# Patient Record
Sex: Male | Born: 1975 | Race: White | Hispanic: No | Marital: Married | Smoking: Never smoker
Health system: Southern US, Community
[De-identification: ages and names within clinical notes are randomized; demographics above are authoritative.]

## PROBLEM LIST (undated history)

## (undated) DIAGNOSIS — K219 Gastro-esophageal reflux disease without esophagitis: Secondary | ICD-10-CM

## (undated) DIAGNOSIS — F419 Anxiety disorder, unspecified: Secondary | ICD-10-CM

## (undated) DIAGNOSIS — E785 Hyperlipidemia, unspecified: Secondary | ICD-10-CM

## (undated) DIAGNOSIS — T7840XA Allergy, unspecified, initial encounter: Secondary | ICD-10-CM

## (undated) DIAGNOSIS — I10 Essential (primary) hypertension: Secondary | ICD-10-CM

## (undated) HISTORY — DX: Hyperlipidemia, unspecified: E78.5

## (undated) HISTORY — DX: Essential (primary) hypertension: I10

## (undated) HISTORY — DX: Allergy, unspecified, initial encounter: T78.40XA

## (undated) HISTORY — PX: KNEE SURGERY: SHX244

## (undated) HISTORY — DX: Gastro-esophageal reflux disease without esophagitis: K21.9

## (undated) HISTORY — PX: WISDOM TOOTH EXTRACTION: SHX21

## (undated) HISTORY — DX: Anxiety disorder, unspecified: F41.9

---

## 2002-01-08 ENCOUNTER — Ambulatory Visit (HOSPITAL_BASED_OUTPATIENT_CLINIC_OR_DEPARTMENT_OTHER): Admission: RE | Admit: 2002-01-08 | Discharge: 2002-01-09 | Payer: Self-pay | Admitting: Orthopedic Surgery

## 2014-11-03 ENCOUNTER — Encounter: Payer: Self-pay | Admitting: Internal Medicine

## 2014-12-17 ENCOUNTER — Ambulatory Visit (INDEPENDENT_AMBULATORY_CARE_PROVIDER_SITE_OTHER): Payer: BLUE CROSS/BLUE SHIELD | Admitting: Internal Medicine

## 2014-12-17 ENCOUNTER — Encounter: Payer: Self-pay | Admitting: Internal Medicine

## 2014-12-17 VITALS — BP 140/80 | HR 81 | Ht 70.0 in | Wt 206.2 lb

## 2014-12-17 DIAGNOSIS — K219 Gastro-esophageal reflux disease without esophagitis: Secondary | ICD-10-CM | POA: Diagnosis not present

## 2014-12-17 DIAGNOSIS — Z800 Family history of malignant neoplasm of digestive organs: Secondary | ICD-10-CM | POA: Diagnosis not present

## 2014-12-17 MED ORDER — MOVIPREP 100 G PO SOLR: 1.0000 | Freq: Once | ORAL | Status: DC

## 2014-12-17 NOTE — Progress Notes (Signed)
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 Shelia Media,  regarding a family history of colon cancer and screening colonoscopy. First, patient reports long-standing problems with indigestion and heartburn. He takes over-the-counter omeprazole 10 mg daily. He is done this for several years. There has been no dysphagia. No family history of Barrett's esophagus or esophageal cancer. Next, he reports that his mother was diagnosed with colon cancer in her 23s. He was advised to undergo screening colonoscopy at age 27. He presents. GI review of systems is otherwise negative.  REVIEW OF SYSTEMS:  All non-GI ROS negative except for anxiety  Past Medical History  Diagnosis Date  . Anxiety   . GERD (gastroesophageal reflux disease)     Past Surgical History  Procedure Laterality Date  . Knee surgery      Social History TEYON ODETTE  reports that he has never smoked. He does not have any smokeless tobacco history on file. He reports that he drinks alcohol. He reports that he does not use illicit drugs.  family history includes Colon cancer in his mother.  No Known Allergies     PHYSICAL EXAMINATION: Vital signs: BP 140/80 mmHg  Pulse 81  Ht 5\' 10"  (1.778 m)  Wt 206 lb 3.2 oz (93.532 kg)  BMI 29.59 kg/m2  SpO2 96%  Constitutional: generally well-appearing, no acute distress Psychiatric: alert and oriented x3, cooperative Eyes: extraocular movements intact, anicteric, conjunctiva pink Mouth: oral pharynx moist, no lesions Neck: supple no lymphadenopathy Cardiovascular: heart regular rate and rhythm, no murmur Lungs: clear to auscultation bilaterally Abdomen: soft, nontender, nondistended, no obvious ascites, no peritoneal signs, normal bowel sounds, no organomegaly Rectal: Deferred until colonoscopy Extremities: no lower extremity edema  bilaterally Skin: no lesions on visible extremities Neuro: No focal deficits.   ASSESSMENT:  #1. GERD without alarm features managed with low-dose PPI #2. MI history of colon cancer in his mother-60s   PLAN:  #1. Reflux precautions #2. Continue with lowest dose of acid suppressive medication to control symptoms #3. Screening colonoscopy.The nature of the procedure, as well as the risks, benefits, and alternatives were carefully and thoroughly reviewed with the patient. Ample time for discussion and questions allowed. The patient understood, was satisfied, and agreed to proceed.  A copy has been sent to his primary care physician Dr. Shelia Media

## 2014-12-17 NOTE — Patient Instructions (Addendum)
You have been scheduled for a colonoscopy. Please follow written instructions given to you at your visit today.  Please pick up your prep supplies at the pharmacy within the next 1-3 days. If you use inhalers (even only as needed), please bring them with you on the day of your procedure.   

## 2015-01-01 ENCOUNTER — Encounter: Payer: Self-pay | Admitting: Internal Medicine

## 2015-01-15 ENCOUNTER — Other Ambulatory Visit: Payer: Self-pay | Admitting: Internal Medicine

## 2015-01-15 ENCOUNTER — Ambulatory Visit (AMBULATORY_SURGERY_CENTER): Payer: BLUE CROSS/BLUE SHIELD | Admitting: Internal Medicine

## 2015-01-15 ENCOUNTER — Encounter: Payer: Self-pay | Admitting: Internal Medicine

## 2015-01-15 VITALS — BP 92/47 | HR 51 | Temp 96.4°F | Resp 23 | Ht 70.0 in | Wt 206.0 lb

## 2015-01-15 DIAGNOSIS — Z1211 Encounter for screening for malignant neoplasm of colon: Secondary | ICD-10-CM

## 2015-01-15 DIAGNOSIS — Z800 Family history of malignant neoplasm of digestive organs: Secondary | ICD-10-CM | POA: Diagnosis present

## 2015-01-15 DIAGNOSIS — D124 Benign neoplasm of descending colon: Secondary | ICD-10-CM

## 2015-01-15 DIAGNOSIS — K635 Polyp of colon: Secondary | ICD-10-CM

## 2015-01-15 HISTORY — PX: COLONOSCOPY: SHX174

## 2015-01-15 MED ORDER — SODIUM CHLORIDE 0.9 % IV SOLN: 500.0000 mL | INTRAVENOUS | Status: DC

## 2015-01-15 NOTE — Progress Notes (Signed)
Called to room to assist during endoscopic procedure.  Patient ID and intended procedure confirmed with present staff. Received instructions for my participation in the procedure from the performing physician.  

## 2015-01-15 NOTE — Progress Notes (Signed)
Patient awakening,vss,report to rn 

## 2015-01-15 NOTE — Op Note (Signed)
[facility]  [address] [place], [address]   COLONOSCOPY PROCEDURE REPORT  PATIENT: Evan, Phillips  MR#: [id number] BIRTHDATE: 07/31/1976 , 39  yrs. old GENDER: male ENDOSCOPIST: Eustace Quail, MD REFERRED VH:QIONGE Delight Hoh, M.D. PROCEDURE DATE:  01/15/2015 PROCEDURE:   Colonoscopy, screening and Colonoscopy with snare polypectomy x 1 First Screening Colonoscopy - Avg.  risk and is 50 yrs.  old or older - No.  Prior Negative Screening - Now for repeat screening. N/A  History of Adenoma - Now for follow-up colonoscopy & has been > or = to 3 yrs.  N/A  Polyps removed today? Yes ASA CLASS:   Class I INDICATIONS:Screening for colonic neoplasia and FH Colon or Rectal Adenocarcinoma. Mom 25s MEDICATIONS: Monitored anesthesia care and Propofol 400 mg IV  DESCRIPTION OF PROCEDURE:   After the risks benefits and alternatives of the procedure were thoroughly explained, informed consent was obtained.  The digital rectal exam revealed no abnormalities of the rectum.   The LB [id number]-H190 [id number]  endoscope was introduced through the anus and advanced to the cecum, which was identified by both the appendix and ileocecal valve. No adverse events experienced.   The quality of the prep was excellent. (MoviPrep was used)  The instrument was then slowly withdrawn as the colon was fully examined.      COLON FINDINGS: A single polyp measuring 2 mm in size was found in the descending colon.  A polypectomy was performed with a cold snare.  The resection was complete, the polyp tissue was completely retrieved and sent to histology.   There was moderate diverticulosis noted in the sigmoid colon.   The examination was otherwise normal.  Retroflexed views revealed internal hemorrhoids. The time to cecum = 2.2 Withdrawal time = 11.8   The scope was withdrawn and the procedure completed. COMPLICATIONS: There were no immediate complications.  ENDOSCOPIC IMPRESSION: 1.   Single  polyp was found in the descending colon; polypectomy was performed with a cold snare 2.   Moderate diverticulosis was noted in the sigmoid colon 3.   The examination was otherwise normal  RECOMMENDATIONS: 1. Follow up colonoscopy in 5 years  (family history)  eSigned:  Eustace Quail, MD 01/15/2015 2:50 PM   cc: Alden Server, MD and The Patient

## 2015-01-15 NOTE — Patient Instructions (Signed)
Impressions/recommendations:  Polyp (handout given) Diverticulosis (handout given) High fiber diet (handout given)  YOU HAD AN ENDOSCOPIC PROCEDURE TODAY AT [facility]:   Refer to the procedure report that was given to you for any specific questions about what was found during the examination.  If the procedure report does not answer your questions, please call your gastroenterologist to clarify.  If you requested that your care partner not be given the details of your procedure findings, then the procedure report has been included in a sealed envelope for you to review at your convenience later.  YOU SHOULD EXPECT: Some feelings of bloating in the abdomen. Passage of more gas than usual.  Walking can help get rid of the air that was put into your GI tract during the procedure and reduce the bloating. If you had a lower endoscopy (such as a colonoscopy or flexible sigmoidoscopy) you may notice spotting of blood in your stool or on the toilet paper. If you underwent a bowel prep for your procedure, you may not have a normal bowel movement for a few days.  Please Note:  You might notice some irritation and congestion in your nose or some drainage.  This is from the oxygen used during your procedure.  There is no need for concern and it should clear up in a day or so.  SYMPTOMS TO REPORT IMMEDIATELY:   Following lower endoscopy (colonoscopy or flexible sigmoidoscopy):  Excessive amounts of blood in the stool  Significant tenderness or worsening of abdominal pains  Swelling of the abdomen that is new, acute  Fever of 100F or higher   For urgent or emergent issues, a gastroenterologist can be reached at any hour by calling [phone].   DIET: Your first meal following the procedure should be a small meal and then it is ok to progress to your normal diet. Heavy or fried foods are harder to digest and may make you feel nauseous or bloated.  Likewise, meals heavy in dairy and  vegetables can increase bloating.  Drink plenty of fluids but you should avoid alcoholic beverages for 24 hours.  ACTIVITY:  You should plan to take it easy for the rest of today and you should NOT DRIVE or use heavy machinery until tomorrow (because of the sedation medicines used during the test).    FOLLOW UP: Our staff will call the number listed on your records the next business day following your procedure to check on you and address any questions or concerns that you may have regarding the information given to you following your procedure. If we do not reach you, we will leave a message.  However, if you are feeling well and you are not experiencing any problems, there is no need to return our call.  We will assume that you have returned to your regular daily activities without incident.  If any biopsies were taken you will be contacted by phone or by letter within the next 1-3 weeks.  Please call us at [phone] if you have not heard about the biopsies in 3 weeks.    SIGNATURES/CONFIDENTIALITY: You and/or your care partner have signed paperwork which will be entered into your electronic medical record.  These signatures attest to the fact that that the information above on your After Visit Summary has been reviewed and is understood.  Full responsibility of the confidentiality of this discharge information lies with you and/or your care-partner.

## 2015-01-16 ENCOUNTER — Telehealth: Payer: Self-pay | Admitting: *Deleted

## 2015-01-16 NOTE — Telephone Encounter (Signed)
  Follow up Call-  Call back number 01/15/2015  Post procedure Call Back phone  # [phone] hm  Permission to leave phone message Yes     Patient questions:  Do you have a fever, pain , or abdominal swelling? No. Pain Score  0 *  Have you tolerated food without any problems? Yes.    Have you been able to return to your normal activities? Yes.    Do you have any questions about your discharge instructions: Diet   No. Medications  No. Follow up visit  No.  Do you have questions or concerns about your Care? No.  Actions: * If pain score is 4 or above: No action needed, pain <4.

## 2015-01-20 ENCOUNTER — Encounter: Payer: Self-pay | Admitting: Internal Medicine

## 2019-10-14 ENCOUNTER — Ambulatory Visit: Payer: Self-pay

## 2019-10-14 ENCOUNTER — Ambulatory Visit: Payer: BC Managed Care – PPO | Attending: Internal Medicine

## 2019-10-14 DIAGNOSIS — Z20822 Contact with and (suspected) exposure to covid-19: Secondary | ICD-10-CM

## 2019-10-14 DIAGNOSIS — U071 COVID-19: Secondary | ICD-10-CM | POA: Insufficient documentation

## 2019-10-15 LAB — NOVEL CORONAVIRUS, NAA: SARS-CoV-2, NAA: DETECTED — AB

## 2020-01-08 ENCOUNTER — Encounter: Payer: Self-pay | Admitting: Gastroenterology

## 2020-07-21 ENCOUNTER — Encounter: Payer: Self-pay | Admitting: Internal Medicine

## 2020-07-21 ENCOUNTER — Ambulatory Visit (AMBULATORY_SURGERY_CENTER): Payer: Self-pay | Admitting: *Deleted

## 2020-07-21 ENCOUNTER — Other Ambulatory Visit: Payer: Self-pay

## 2020-07-21 VITALS — Ht 70.0 in | Wt 197.0 lb

## 2020-07-21 DIAGNOSIS — Z800 Family history of malignant neoplasm of digestive organs: Secondary | ICD-10-CM

## 2020-07-21 MED ORDER — SUTAB 1479-225-188 MG PO TABS: 1.0000 | ORAL_TABLET | ORAL | 0 refills | Status: DC

## 2020-07-21 NOTE — Progress Notes (Signed)
Patient is here in-person for PV. Patient denies any allergies to eggs or soy. Patient denies any problems with anesthesia/sedation. Patient denies any oxygen use at home. Patient denies taking any diet/weight loss medications or blood thinners. Patient is not being treated for MRSA or C-diff. Patient is aware of our care-partner policy and [id number] safety protocol.    COVID-19 vaccines completed on 05/12/20, per patient.   Prep Prescription coupon given to the patient.

## 2020-08-04 ENCOUNTER — Other Ambulatory Visit: Payer: Self-pay

## 2020-08-04 ENCOUNTER — Encounter: Payer: Self-pay | Admitting: Internal Medicine

## 2020-08-04 ENCOUNTER — Ambulatory Visit (AMBULATORY_SURGERY_CENTER): Payer: BC Managed Care – PPO | Admitting: Internal Medicine

## 2020-08-04 VITALS — BP 111/53 | HR 54 | Temp 97.1°F | Resp 11 | Ht 70.0 in | Wt 197.0 lb

## 2020-08-04 DIAGNOSIS — Z800 Family history of malignant neoplasm of digestive organs: Secondary | ICD-10-CM

## 2020-08-04 DIAGNOSIS — Z86010 Personal history of colonic polyps: Secondary | ICD-10-CM | POA: Diagnosis not present

## 2020-08-04 DIAGNOSIS — Z1211 Encounter for screening for malignant neoplasm of colon: Secondary | ICD-10-CM

## 2020-08-04 MED ORDER — SODIUM CHLORIDE 0.9 % IV SOLN: 500.0000 mL | Freq: Once | INTRAVENOUS | Status: DC

## 2020-08-04 NOTE — Progress Notes (Signed)
Report to PACU, RN, vss, BBS= Clear.  

## 2020-08-04 NOTE — Progress Notes (Signed)
previsit done by RM.  Pt states no changes to health hx since visit.  VS by CW

## 2020-08-04 NOTE — Op Note (Signed)
[facility] Patient Name: Evan Phillips Procedure Date: 08/04/2020 10:07 AM MRN: [id number] Endoscopist: Docia Chuck. Henrene Pastor , MD Age: 44 Referring MD:  Date of Birth: 1975/10/12 Gender: Male Account #: [id number] Procedure:                Colonoscopy Indications:              Screening in patient at increased risk: Colorectal                            cancer in mother age 109's. Previous examination May                            2016 was negative for neoplasia Medicines:                Monitored Anesthesia Care Procedure:                Pre-Anesthesia Assessment:                           - Prior to the procedure, a History and Physical                            was performed, and patient medications and                            allergies were reviewed. The patient's tolerance of                            previous anesthesia was also reviewed. The risks                            and benefits of the procedure and the sedation                            options and risks were discussed with the patient.                            All questions were answered, and informed consent                            was obtained. Prior Anticoagulants: The patient has                            taken no previous anticoagulant or antiplatelet                            agents. ASA Grade Assessment: II - A patient with                            mild systemic disease. After reviewing the risks                            and benefits, the patient was deemed in  satisfactory condition to undergo the procedure.                           After obtaining informed consent, the colonoscope                            was passed under direct vision. Throughout the                            procedure, the patient's blood pressure, pulse, and                            oxygen saturations were monitored continuously. The                            Colonoscope was introduced  through the anus and                            advanced to the the cecum, identified by                            appendiceal orifice and ileocecal valve. The                            ileocecal valve, appendiceal orifice, and rectum                            were photographed. The quality of the bowel                            preparation was excellent. The colonoscopy was                            performed without difficulty. The patient tolerated                            the procedure well. The bowel preparation used was                            SUPREP via split dose instruction. Scope In: 10:15:16 AM Scope Out: 10:26:54 AM Scope Withdrawal Time: 0 hours 9 minutes 17 seconds  Total Procedure Duration: 0 hours 11 minutes 38 seconds  Findings:                 Multiple diverticula were found in the sigmoid                            colon.                           Internal hemorrhoids were found during                            retroflexion. The hemorrhoids were moderate.  The exam was otherwise without abnormality on                            direct and retroflexion views. Complications:            No immediate complications. Estimated blood loss:                            None. Estimated Blood Loss:     Estimated blood loss: none. Impression:               - Diverticulosis in the sigmoid colon.                           - Internal hemorrhoids.                           - The examination was otherwise normal on direct                            and retroflexion views.                           - No specimens collected. Recommendation:           - Repeat colonoscopy in 5 years for surveillance                            (family history).                           - Patient has a contact number available for                            emergencies. The signs and symptoms of potential                            delayed complications were discussed  with the                            patient. Return to normal activities tomorrow.                            Written discharge instructions were provided to the                            patient.                           - Resume previous diet.                           - Continue present medications. Docia Chuck. Henrene Pastor, MD 08/04/2020 10:35:39 AM This report has been signed electronically.

## 2020-08-04 NOTE — Patient Instructions (Signed)
Handouts Provided:  Diverticulosis ? ?YOU HAD AN ENDOSCOPIC PROCEDURE TODAY AT THE [facility]:   Refer to the procedure report that was given to you for any specific questions about what was found during the examination.  If the procedure report does not answer your questions, please call your gastroenterologist to clarify.  If you requested that your care partner not be given the details of your procedure findings, then the procedure report has been included in a sealed envelope for you to review at your convenience later. ? ?YOU SHOULD EXPECT: Some feelings of bloating in the abdomen. Passage of more gas than usual.  Walking can help get rid of the air that was put into your GI tract during the procedure and reduce the bloating. If you had a lower endoscopy (such as a colonoscopy or flexible sigmoidoscopy) you may notice spotting of blood in your stool or on the toilet paper. If you underwent a bowel prep for your procedure, you may not have a normal bowel movement for a few days. ? ?Please Note:  You might notice some irritation and congestion in your nose or some drainage.  This is from the oxygen used during your procedure.  There is no need for concern and it should clear up in a day or so. ? ?SYMPTOMS TO REPORT IMMEDIATELY: ? ?Following lower endoscopy (colonoscopy or flexible sigmoidoscopy): ? Excessive amounts of blood in the stool ? Significant tenderness or worsening of abdominal pains ? Swelling of the abdomen that is new, acute ? Fever of 100?F or higher ? ?For urgent or emergent issues, a gastroenterologist can be reached at any hour by calling [phone]. ?Do not use MyChart messaging for urgent concerns.  ? ? ?DIET:  We do recommend a small meal at first, but then you may proceed to your regular diet.  Drink plenty of fluids but you should avoid alcoholic beverages for 24 hours. ? ?ACTIVITY:  You should plan to take it easy for the rest of today and you should NOT DRIVE or use heavy  machinery until tomorrow (because of the sedation medicines used during the test).   ? ?FOLLOW UP: ?Our staff will call the number listed on your records 48-72 hours following your procedure to check on you and address any questions or concerns that you may have regarding the information given to you following your procedure. If we do not reach you, we will leave a message.  We will attempt to reach you two times.  During this call, we will ask if you have developed any symptoms of COVID 19. If you develop any symptoms (ie: fever, flu-like symptoms, shortness of breath, cough etc.) before then, please call [phone].  If you test positive for Covid 19 in the 2 weeks post procedure, please call and report this information to us.   ? ?If any biopsies were taken you will be contacted by phone or by letter within the next 1-3 weeks.  Please call us at [phone] if you have not heard about the biopsies in 3 weeks.  ? ? ?SIGNATURES/CONFIDENTIALITY: ?You and/or your care partner have signed paperwork which will be entered into your electronic medical record.  These signatures attest to the fact that that the information above on your After Visit Summary has been reviewed and is understood.  Full responsibility of the confidentiality of this discharge information lies with you and/or your care-partner. ? ?

## 2020-08-06 ENCOUNTER — Telehealth: Payer: Self-pay

## 2020-08-06 NOTE — Telephone Encounter (Signed)
  Follow up Call-  Call back number 08/04/2020  Post procedure Call Back phone  # [phone]  Permission to leave phone message Yes  Some recent data might be hidden     Patient questions:  Do you have a fever, pain , or abdominal swelling? No. Pain Score  0 *  Have you tolerated food without any problems? Yes.    Have you been able to return to your normal activities? Yes.    Do you have any questions about your discharge instructions: Diet   No. Medications  No. Follow up visit  No.  Do you have questions or concerns about your Care? No.  Actions: * If pain score is 4 or above: No action needed, pain <4.  1. Have you developed a fever since your procedure? no  2.   Have you had an respiratory symptoms (SOB or cough) since your procedure? no  3.   Have you tested positive for COVID 19 since your procedure no  4.   Have you had any family members/close contacts diagnosed with the COVID 19 since your procedure?  no   If yes to any of these questions please route to Joylene John, RN and Joella Prince, RN

## 2021-01-19 ENCOUNTER — Other Ambulatory Visit: Payer: Self-pay | Admitting: Chiropractic Medicine

## 2021-01-19 DIAGNOSIS — M25562 Pain in left knee: Secondary | ICD-10-CM

## 2021-01-31 ENCOUNTER — Ambulatory Visit
Admission: RE | Admit: 2021-01-31 | Discharge: 2021-01-31 | Disposition: A | Discharge status: Home / Self Care | Payer: BC Managed Care – PPO | Source: Ambulatory Visit | Attending: Chiropractic Medicine | Admitting: Chiropractic Medicine

## 2021-01-31 DIAGNOSIS — M25562 Pain in left knee: Secondary | ICD-10-CM

## 2021-04-06 ENCOUNTER — Ambulatory Visit: Payer: BC Managed Care – PPO | Attending: Orthopedic Surgery

## 2021-04-06 ENCOUNTER — Other Ambulatory Visit: Payer: Self-pay

## 2021-04-06 DIAGNOSIS — M6281 Muscle weakness (generalized): Secondary | ICD-10-CM | POA: Diagnosis present

## 2021-04-06 DIAGNOSIS — M25562 Pain in left knee: Secondary | ICD-10-CM | POA: Diagnosis present

## 2021-04-06 DIAGNOSIS — M25662 Stiffness of left knee, not elsewhere classified: Secondary | ICD-10-CM | POA: Diagnosis present

## 2021-04-06 DIAGNOSIS — R2689 Other abnormalities of gait and mobility: Secondary | ICD-10-CM | POA: Diagnosis present

## 2021-04-06 DIAGNOSIS — R600 Localized edema: Secondary | ICD-10-CM | POA: Diagnosis present

## 2021-04-06 NOTE — Patient Instructions (Signed)
Access Code: [email or web address] URL: [email or web address] Date: 04/06/2021 Prepared by: Claiborne Billings  Exercises Long Sitting 4 Way Patellar Glide - 2 x daily - 7 x weekly - 1 sets - 10 reps Supine Quad Set - 3 x daily - 7 x weekly - 2 sets - 10 reps - 5 hold Standing Weight Shift - 2 x daily - 7 x weekly - 2 sets - 10 reps Stride Stance Weight Shift - 1 x daily - 7 x weekly - 3 sets - 10 reps

## 2021-04-06 NOTE — Therapy (Addendum)
[facility] [address], [place], [place], [address] Phone: [phone]   Fax:  [phone]  Physical Therapy Evaluation  Patient Details  Name: Evan Phillips MRN: [id number] Date of Birth: [birth year] Referring Provider (PT): Harden Mo, MD   Encounter Date: 04/06/2021   PT End of Session - 04/06/21 1148     Visit Number 1    Date for PT Re-Evaluation 06/01/21    Authorization Type BCBS- no visit limit    PT Start Time 1106    PT Stop Time 1152    PT Time Calculation (min) 46 min    Equipment Utilized During Treatment Left knee immobilizer    Activity Tolerance Patient tolerated treatment well    Behavior During Therapy [facility] for tasks assessed/performed             Past Medical History:  Diagnosis Date   Allergy    Anxiety    GERD (gastroesophageal reflux disease)    Hyperlipidemia    Hypertension     Past Surgical History:  Procedure Laterality Date   COLONOSCOPY  01/15/2015   Henrene Pastor hyperplastic polyp   KNEE SURGERY     WISDOM TOOTH EXTRACTION      There were no vitals filed for this visit.    Subjective Assessment - 04/06/21 1111     Subjective Pt reports to PT s/p Lt ACL allograft repair on 03/31/21.  Pt had injury in April 2022 and has been wearing a brace prior to surgery.  Pt is now walking with immobilizer and 1 crutch.    Pertinent History ACL repair 03/31/21, HTN.   FOLLOW PROTOCOL- in staff office drawer    Patient Stated Goals return to prior level of function, improve strength of Lt LE, walk without device    Currently in Pain? Yes    Pain Score 3     Pain Location Knee    Pain Orientation Left    Pain Type Surgical pain    Pain Onset 1 to 4 weeks ago    Pain Frequency Intermittent    Aggravating Factors  standing, walking    Pain Relieving Factors rest, ice, sometimes pain meds                [facility] PT Assessment - 04/06/21 0001       Assessment   Medical Diagnosis  s/p Lt ACL reconstruction    Referring Provider (PT) Harden Mo, MD    Onset Date/Surgical Date 03/31/21    Next MD Visit today      Precautions   Precautions Knee    Precaution Comments ACL repair- follow protocol    Required Braces or Orthoses Knee Immobilizer - Left      Restrictions   Weight Bearing Restrictions No      Balance Screen   Has the patient fallen in the past 6 months No    Has the patient had a decrease in activity level because of a fear of falling?  No    Is the patient reluctant to leave their home because of a fear of falling?  No      Home Social worker Private residence    Living Arrangements Spouse/significant other;Children    Type of [place] One level    [place]      Prior Function   Level of [place] Full time employment    Vocation  Requirements works from home    Sigel exercises      Cognition   Overall Cognitive Status Within Functional Limits for tasks assessed      Observation/Other Assessments   Focus on Therapeutic Outcomes (FOTO)  36 (goal is 74)      Posture/Postural Control   Posture/Postural Control No significant limitations      ROM / Strength   AROM / PROM / Strength AROM;PROM;Strength      AROM   Overall AROM  Deficits    Overall AROM Comments Lt knee 0-40 per protocol limits, Rt knee WFLs      PROM   Overall PROM  Deficits    Overall PROM Comments Rt knee 0-90 limited by protocol      Strength   Overall Strength Due to precautions;Unable to assess    Overall Strength Comments Rt LE 5/5.  Lt hip 4+/5, knee not tested      Palpation   Patella mobility normal medial/lateral mobility, limited inferior mobility by 25%    Palpation comment Boggy edema at distal Lt quad.  Midpatellar Lt 16 in, Rt 15.5 in.  Distal quad: LT 17.5 in, Rt 16 inches      Transfers   Transfers Sit to Stand;Stand to Sit    Sit to Stand With upper extremity  assist    Stand to Sit 6: Modified independent (Device/Increase time);With upper extremity assist      Ambulation/Gait   Ambulation/Gait Yes    Ambulation/Gait Assistance 6: Modified independent (Device/Increase time)    Assistive device Crutches    Gait Pattern Step-through pattern;Decreased stance time - left    Ambulation Surface Level    Gait Comments no knee flexion on the Lt due to immobilizer                        Objective measurements completed on examination: See above findings.       [place] Adult PT Treatment/Exercise - 04/06/21 0001       Modalities   Modalities Vasopneumatic      Vasopneumatic   Number Minutes Vasopneumatic  15 minutes    Vasopnuematic Location  Knee    Vasopneumatic Pressure Medium    Vasopneumatic Temperature  3 snowflakes                    PT Education - 04/06/21 1142     Education Details Access Code: [email or web address]    Person(s) Educated Patient    Methods Explanation;Demonstration;Handout    Comprehension Verbalized understanding;Returned demonstration              PT Short Term Goals - 04/06/21 1201       PT SHORT TERM GOAL #1   Title be independent in initial HEP    Time 4    Period Weeks    Status New    Target Date 05/04/21      PT SHORT TERM GOAL #2   Title demonstrate good quad set to perform SLR x 5 without lag    Time 4    Status New    Target Date 05/04/21      PT SHORT TERM GOAL #3   Title wean from immobilizer and demonstrate quad control and safe gait on all distances    Time 4    Period Weeks    Status New    Target Date 05/04/21      PT SHORT TERM GOAL #4  Title demonstrate 100 degrees of Lt knee flexion active and passively to improve sit to stand and negotiating steps    Time 4    Period Weeks    Status New    Target Date 05/04/21               PT Long Term Goals - 04/06/21 1203       PT LONG TERM GOAL #1   Title be independent in advanced HEP    Time 8     Period Weeks    Status New    Target Date 06/01/21      PT LONG TERM GOAL #2   Title improve FOTO to > or = to 74 to improve functional mobility    Time 8    Period Weeks    Status New    Target Date 06/01/21      PT LONG TERM GOAL #3   Title wean from crutch and immobilizer and demonstrate quad control with level surface ambulation and ascending steps on Lt    Time 8    Period Weeks    Status New    Target Date 06/01/21      PT LONG TERM GOAL #4   Title demonstrate Lt knee A/ROM flexion to > or = to 125 degrees to improve squatting    Time 8    Period Weeks    Target Date 06/01/21      PT LONG TERM GOAL #5   Title demonstrate > or = to 4+/5 Lt quad, hamstring and hip flexor strength to improve endurance and safety in the community    Time 8    Period Weeks    Status New    Target Date 06/01/21      Additional Long Term Goals   Additional Long Term Goals Yes      PT LONG TERM GOAL #6   Title improve Lt quad strength to descend steps with good eccentric control    Time 8    Status New    Target Date 06/01/21                    Plan - 04/06/21 1159     Clinical Impression Statement Pt presents to PT s/p Lt ACL repair using allograft.  Pt had injury to the knee with ACL rupture in April 2022.  Pt has been ambulating with a single crutch and knee immobilizer for all distances.  A/ROM and P/ROM of the Lt knee are limited by protocol at this time and pt is using CPM at home.  Pt with poor quad set in supine and instability of the Lt knee with weightbearing into the Lt LE at countertop. Reduced patellar mobility superior/inferior and boggy edema in the distal Lt quad.  Pt ambulates with a single crutch with step through wearing immobilizer.  Pt will benefit from skilled PT for safe progression of ROM, strength and gait following a post-op protocol.    Personal Factors and Comorbidities Comorbidity 1    Comorbidities ACL repair-LT    Examination-Activity Limitations  Locomotion Level;Squat;Stairs;Stand;Transfers    Examination-Participation Restrictions Community Activity;Occupation    Stability/Clinical Decision Making Stable/Uncomplicated    Clinical Decision Making Low    Rehab Potential Excellent    PT Frequency 2x / week    PT Duration 8 weeks    PT Treatment/Interventions ADLs/Self Care Home Management;Cryotherapy;Electrical Stimulation;Moist Heat;Iontophoresis '4mg'$ /ml Dexamethasone;Gait training;Stair training;Functional mobility training;Therapeutic activities;Therapeutic exercise;Balance training;Neuromuscular re-education;Manual techniques;Patient/family education;Passive range of  motion;Taping;Vasopneumatic Device    PT Next Visit Plan work on quad strength, edema management, follow protocol for ROM and strength    PT Home Exercise Plan Access Code: [email or web address]             Patient will benefit from skilled therapeutic intervention in order to improve the following deficits and impairments:  Abnormal gait, Decreased activity tolerance, Decreased balance, Decreased strength, Increased edema, Impaired flexibility, Decreased scar mobility, Pain, Decreased endurance, Difficulty walking, Decreased range of motion  Visit Diagnosis: Acute pain of left knee - Plan: PT plan of care cert/re-cert  Stiffness of left knee, not elsewhere classified - Plan: PT plan of care cert/re-cert  Other abnormalities of gait and mobility - Plan: PT plan of care cert/re-cert  Localized edema - Plan: PT plan of care cert/re-cert  Muscle weakness (generalized) - Plan: PT plan of care cert/re-cert     Problem List There are no problems to display for this patient.    [name], PT 04/06/21 12:10 PM   [facility] [address] [place], [place], [address] Phone: [phone]   Fax:  [phone]  Name: BAYLEY BEAMESDERFER MRN: [id number] Date of Birth: 1975-09-18

## 2021-04-08 ENCOUNTER — Other Ambulatory Visit: Payer: Self-pay

## 2021-04-08 ENCOUNTER — Ambulatory Visit: Payer: BC Managed Care – PPO

## 2021-04-08 DIAGNOSIS — R600 Localized edema: Secondary | ICD-10-CM

## 2021-04-08 DIAGNOSIS — M25562 Pain in left knee: Secondary | ICD-10-CM

## 2021-04-08 DIAGNOSIS — M25662 Stiffness of left knee, not elsewhere classified: Secondary | ICD-10-CM

## 2021-04-08 DIAGNOSIS — R2689 Other abnormalities of gait and mobility: Secondary | ICD-10-CM

## 2021-04-08 DIAGNOSIS — M6281 Muscle weakness (generalized): Secondary | ICD-10-CM

## 2021-04-08 NOTE — Patient Instructions (Signed)
Access Code: [email or web address] URL: [email or web address] Date: 04/08/2021 Prepared by: Claiborne Billings  Seated Hamstring Stretch - 2 x daily - 7 x weekly - 1 sets - 3 reps - 20 hold Standing Heel Raises - 2 x daily - 7 x weekly - 2 sets - 10 reps Supine Knee Extension Strengthening - 2 x daily - 7 x weekly - 2 sets - 10 reps - 5 hold Forward Step Up - 2 x daily - 7 x weekly - 2 sets - 10 reps

## 2021-04-08 NOTE — Therapy (Signed)
[facility] [address], [place], [place], [address] Phone: [phone]   Fax:  [phone]  Physical Therapy Treatment  Patient Details  Name: Evan Phillips MRN: [id number] Date of Birth: 19-Dec-1975 Referring Provider (Evan Phillips): Harden Mo, MD   Encounter Date: 04/08/2021   Evan Phillips End of Session - 04/08/21 1139     Visit Number 2    Date for Evan Phillips Re-Evaluation 06/01/21    Authorization Type BCBS- no visit limit    Evan Phillips Start Time 1101    Evan Phillips Stop Time 1152    Evan Phillips Time Calculation (min) 51 min    Activity Tolerance Patient tolerated treatment well    Behavior During Therapy [facility] for tasks assessed/performed             Past Medical History:  Diagnosis Date   Allergy    Anxiety    GERD (gastroesophageal reflux disease)    Hyperlipidemia    Hypertension     Past Surgical History:  Procedure Laterality Date   COLONOSCOPY  01/15/2015   Henrene Pastor hyperplastic polyp   KNEE SURGERY     WISDOM TOOTH EXTRACTION      There were no vitals filed for this visit.   Subjective Assessment - 04/08/21 1102     Subjective Saw MD.  Now wearing a hinged brace for ambulation.  No crutch.    Pertinent History ACL repair 03/31/21, HTN.   FOLLOW PROTOCOL- in staff office drawer    Currently in Pain? No/denies                               [facility] Adult Evan Phillips Treatment/Exercise - 04/08/21 0001       Exercises   Exercises Knee/Hip      Knee/Hip Exercises: Stretches   Active Hamstring Stretch Left;3 reps;20 seconds    Gastroc Stretch 3 reps;20 seconds;Left    Gastroc Stretch Limitations using rockerboard      Knee/Hip Exercises: Aerobic   Recumbent Bike Level 0 x 6 minutes- full revoluations, slow for ROM.      Knee/Hip Exercises: Standing   Lateral Step Up Left;2 sets;5 sets;Hand Hold: 1;Step Height: 6"    Forward Step Up Left;2 sets;10 reps;Step Height: 6"    Other Standing Knee Exercises weight shifting:  forward and lateralx 20 each.      Knee/Hip Exercises: Supine   Quad Sets Strengthening;Left;2 sets;10 reps    Short Arc Quad Sets Strengthening;Left;2 sets;10 reps      Modalities   Modalities Vasopneumatic      Vasopneumatic   Number Minutes Vasopneumatic  15 minutes    Vasopnuematic Location  Knee    Vasopneumatic Pressure Medium    Vasopneumatic Temperature  3 snowflakes                    Evan Phillips Education - 04/08/21 1130     Education Details Access Code: [email or web address]    Person(s) Educated Patient    Methods Explanation;Handout    Comprehension Verbalized understanding;Returned demonstration              Evan Phillips Short Term Goals - 04/06/21 1201       Evan Phillips SHORT TERM GOAL #1   Title be independent in initial HEP    Time 4    Period Weeks    Status New    Target Date 05/04/21      Evan Phillips SHORT TERM GOAL #2  Title demonstrate good quad set to perform SLR x 5 without lag    Time 4    Status New    Target Date 05/04/21      Evan Phillips SHORT TERM GOAL #3   Title wean from immobilizer and demonstrate quad control and safe gait on all distances    Time 4    Period Weeks    Status New    Target Date 05/04/21      Evan Phillips SHORT TERM GOAL #4   Title demonstrate 100 degrees of Lt knee flexion active and passively to improve sit to stand and negotiating steps    Time 4    Period Weeks    Status New    Target Date 05/04/21               Evan Phillips Long Term Goals - 04/06/21 1203       Evan Phillips LONG TERM GOAL #1   Title be independent in advanced HEP    Time 8    Period Weeks    Status New    Target Date 06/01/21      Evan Phillips LONG TERM GOAL #2   Title improve FOTO to > or = to 74 to improve functional mobility    Time 8    Period Weeks    Status New    Target Date 06/01/21      Evan Phillips LONG TERM GOAL #3   Title wean from crutch and immobilizer and demonstrate quad control with level surface ambulation and ascending steps on Lt    Time 8    Period Weeks    Status New    Target Date  06/01/21      Evan Phillips LONG TERM GOAL #4   Title demonstrate Lt knee A/ROM flexion to > or = to 125 degrees to improve squatting    Time 8    Period Weeks    Target Date 06/01/21      Evan Phillips LONG TERM GOAL #5   Title demonstrate > or = to 4+/5 Lt quad, hamstring and hip flexor strength to improve endurance and safety in the community    Time 8    Period Weeks    Status New    Target Date 06/01/21      Additional Long Term Goals   Additional Long Term Goals Yes      Evan Phillips LONG TERM GOAL #6   Title improve Lt quad strength to descend steps with good eccentric control    Time 8    Status New    Target Date 06/01/21                   Plan - 04/08/21 1126     Clinical Impression Statement First time follow-up after evaluation.  Evan Phillips is independent and complaint in initial HEP.  Evan Phillips is able to move to 1-3 week phase of protocol and HEP was updated to reflect.  Evan Phillips is ambulating without a crutch today and demonstrates good quad stability while wearing brace.  Evan Phillips demonstrates some instability as he fatigues and Evan Phillips advised him to use a crutch for longer distances for safety.  Evan Phillips with continued quad atrophy and required tactile cues for quad activation.  Significantly improved quad set and activation today with quad set and short arc quad.   Post-op edema is present in distal quad.  Evan Phillips will continue to benefit from skilled Evan Phillips for safe progression of strength and flexibility s/p ACL repair.    Evan Phillips  Frequency 2x / week    Evan Phillips Duration 8 weeks    Evan Phillips Treatment/Interventions ADLs/Self Care Home Management;Cryotherapy;Electrical Stimulation;Moist Heat;Iontophoresis '4mg'$ /ml Dexamethasone;Gait training;Stair training;Functional mobility training;Therapeutic activities;Therapeutic exercise;Balance training;Neuromuscular re-education;Manual techniques;Patient/family education;Passive range of motion;Taping;Vasopneumatic Device    Evan Phillips Next Visit Plan review new HEP.  Follow protocol    Evan Phillips Home Exercise Plan Access  Code: [email or web address]    Consulted and Agree with Plan of Care Patient             Patient will benefit from skilled therapeutic intervention in order to improve the following deficits and impairments:  Abnormal gait, Decreased activity tolerance, Decreased balance, Decreased strength, Increased edema, Impaired flexibility, Decreased scar mobility, Pain, Decreased endurance, Difficulty walking, Decreased range of motion  Visit Diagnosis: Acute pain of left knee  Stiffness of left knee, not elsewhere classified  Other abnormalities of gait and mobility  Localized edema  Muscle weakness (generalized)     Problem List There are no problems to display for this patient.   Evan Phillips, Evan Phillips 04/08/21 11:40 AM  [facility] [address], [place], [place], [address] Phone: [phone]   Fax:  [phone]  Name: Evan Phillips MRN: [id number] Date of Birth: 03-15-1976

## 2021-04-13 ENCOUNTER — Ambulatory Visit: Payer: BC Managed Care – PPO

## 2021-04-13 ENCOUNTER — Other Ambulatory Visit: Payer: Self-pay

## 2021-04-13 DIAGNOSIS — M6281 Muscle weakness (generalized): Secondary | ICD-10-CM

## 2021-04-13 DIAGNOSIS — R600 Localized edema: Secondary | ICD-10-CM

## 2021-04-13 DIAGNOSIS — R2689 Other abnormalities of gait and mobility: Secondary | ICD-10-CM

## 2021-04-13 DIAGNOSIS — M25662 Stiffness of left knee, not elsewhere classified: Secondary | ICD-10-CM

## 2021-04-13 DIAGNOSIS — M25562 Pain in left knee: Secondary | ICD-10-CM | POA: Diagnosis not present

## 2021-04-13 NOTE — Therapy (Signed)
[facility] [address], [place], [place], [address] Phone: [phone]   Fax:  [phone]  Physical Therapy Treatment  Patient Details  Name: Evan Phillips MRN: [id number] Date of Birth: 1976/01/30 Referring Provider (PT): Harden Mo, MD   Encounter Date: 04/13/2021   PT End of Session - 04/13/21 1057     Visit Number 3    Date for PT Re-Evaluation 06/01/21    Authorization Type BCBS- no visit limit    PT Start Time 1017    PT Stop Time 1110    PT Time Calculation (min) 53 min    Activity Tolerance Patient tolerated treatment well    Behavior During Therapy [facility] for tasks assessed/performed             Past Medical History:  Diagnosis Date   Allergy    Anxiety    GERD (gastroesophageal reflux disease)    Hyperlipidemia    Hypertension     Past Surgical History:  Procedure Laterality Date   COLONOSCOPY  01/15/2015   Henrene Pastor hyperplastic polyp   KNEE SURGERY     WISDOM TOOTH EXTRACTION      There were no vitals filed for this visit.   Subjective Assessment - 04/13/21 1021     Subjective I'm doing well.  Improving every day.    Pertinent History ACL repair 03/31/21, HTN.   FOLLOW PROTOCOL- in staff office drawer    Currently in Pain? No/denies                               [facility] Adult PT Treatment/Exercise - 04/13/21 0001       Knee/Hip Exercises: Stretches   Active Hamstring Stretch Left;3 reps;20 seconds      Knee/Hip Exercises: Aerobic   Recumbent Bike Level 2 x 6 minutes- full revoluations, steady RPM at begining of session      Knee/Hip Exercises: Standing   Lateral Step Up Left;Hand Hold: 1;Step Height: 6";2 sets;10 reps    Forward Step Up Left;2 sets;10 reps;Step Height: 6"    Wall Squat 5 sets;Limitations    Wall Squat Limitations 0-40 degrees    Rocker Board 3 minutes    SLS with Vectors Rt leg with green band and vectors (star pattern) x5 each    Other  Standing Knee Exercises TKE with green band 2x10      Modalities   Modalities Vasopneumatic      Vasopneumatic   Number Minutes Vasopneumatic  15 minutes    Vasopnuematic Location  Knee    Vasopneumatic Pressure Medium    Vasopneumatic Temperature  3 snowflakes                      PT Short Term Goals - 04/13/21 1021       PT SHORT TERM GOAL #1   Title be independent in initial HEP    Status Achieved      PT SHORT TERM GOAL #3   Title wean from immobilizer and demonstrate quad control and safe gait on all distances    Baseline using hinged brace on Lt    Status Achieved               PT Long Term Goals - 04/06/21 1203       PT LONG TERM GOAL #1   Title be independent in advanced HEP    Time 8  Period Weeks    Status New    Target Date 06/01/21      PT LONG TERM GOAL #2   Title improve FOTO to > or = to 74 to improve functional mobility    Time 8    Period Weeks    Status New    Target Date 06/01/21      PT LONG TERM GOAL #3   Title wean from crutch and immobilizer and demonstrate quad control with level surface ambulation and ascending steps on Lt    Time 8    Period Weeks    Status New    Target Date 06/01/21      PT LONG TERM GOAL #4   Title demonstrate Lt knee A/ROM flexion to > or = to 125 degrees to improve squatting    Time 8    Period Weeks    Target Date 06/01/21      PT LONG TERM GOAL #5   Title demonstrate > or = to 4+/5 Lt quad, hamstring and hip flexor strength to improve endurance and safety in the community    Time 8    Period Weeks    Status New    Target Date 06/01/21      Additional Long Term Goals   Additional Long Term Goals Yes      PT LONG TERM GOAL #6   Title improve Lt quad strength to descend steps with good eccentric control    Time 8    Status New    Target Date 06/01/21                   Plan - 04/13/21 1056     Clinical Impression Statement Pt is independent and complaint in initial  HEP.  Pt is doing well with current phase of protocol.  Gait pattern is significantly improved today with symmetry and no crutch.  Pt with improved endurance and reduced quad atrophy today.  Pt demonstrated good single leg stability with vectors and demonstrated fatigue at the end of 2nd set.  Pt with full revolutions and added resistance on bike today. Post-op edema is present in distal quad.  Pt will continue to benefit from skilled PT for safe progression of strength and flexibility s/p ACL repair.    PT Frequency 2x / week    PT Duration 8 weeks    PT Treatment/Interventions ADLs/Self Care Home Management;Cryotherapy;Electrical Stimulation;Moist Heat;Iontophoresis '4mg'$ /ml Dexamethasone;Gait training;Stair training;Functional mobility training;Therapeutic activities;Therapeutic exercise;Balance training;Neuromuscular re-education;Manual techniques;Patient/family education;Passive range of motion;Taping;Vasopneumatic Device    PT Next Visit Plan continue to follow protocol, edema management as needed    PT Home Exercise Plan Access Code: [email or web address]    Consulted and Agree with Plan of Care Patient             Patient will benefit from skilled therapeutic intervention in order to improve the following deficits and impairments:  Abnormal gait, Decreased activity tolerance, Decreased balance, Decreased strength, Increased edema, Impaired flexibility, Decreased scar mobility, Pain, Decreased endurance, Difficulty walking, Decreased range of motion  Visit Diagnosis: Acute pain of left knee  Stiffness of left knee, not elsewhere classified  Other abnormalities of gait and mobility  Localized edema  Muscle weakness (generalized)     Problem List There are no problems to display for this patient.   [name], PT 04/13/21 10:58 AM  [facility] [address] [place], [place], [address] Phone: [phone]   Fax:   [phone]  Name: Evan Phillips MRN: [id number] Date of Birth: 11-Feb-1976

## 2021-04-15 ENCOUNTER — Other Ambulatory Visit: Payer: Self-pay

## 2021-04-15 ENCOUNTER — Ambulatory Visit: Payer: BC Managed Care – PPO

## 2021-04-15 DIAGNOSIS — M25562 Pain in left knee: Secondary | ICD-10-CM

## 2021-04-15 DIAGNOSIS — R600 Localized edema: Secondary | ICD-10-CM

## 2021-04-15 DIAGNOSIS — R2689 Other abnormalities of gait and mobility: Secondary | ICD-10-CM

## 2021-04-15 DIAGNOSIS — M6281 Muscle weakness (generalized): Secondary | ICD-10-CM

## 2021-04-15 DIAGNOSIS — M25662 Stiffness of left knee, not elsewhere classified: Secondary | ICD-10-CM

## 2021-04-15 NOTE — Therapy (Addendum)
[facility] [address] [place], [place], [address] Phone: [phone]   Fax:  [phone]  Physical Therapy Treatment  Patient Details  Name: Evan Phillips MRN: [id number] Date of Birth: 1976/05/07 Referring Provider (PT): Harden Mo, MD   Encounter Date: 04/15/2021   PT End of Session - 04/15/21 1009     Visit Number 4    Date for PT Re-Evaluation 06/01/21    Authorization Type BCBS- no visit limit    PT Start Time 0930    PT Stop Time 1023    PT Time Calculation (min) 53 min    Activity Tolerance Patient tolerated treatment well             Past Medical History:  Diagnosis Date   Allergy    Anxiety    GERD (gastroesophageal reflux disease)    Hyperlipidemia    Hypertension     Past Surgical History:  Procedure Laterality Date   COLONOSCOPY  01/15/2015   Henrene Pastor hyperplastic polyp   KNEE SURGERY     WISDOM TOOTH EXTRACTION      There were no vitals filed for this visit.   Subjective Assessment - 04/15/21 0938     Subjective I felt good after last session.  Not a lot of sorenes.    Pertinent History ACL repair 03/31/21, HTN.   FOLLOW PROTOCOL- in staff office drawer    Currently in Pain? No/denies                [facility] PT Assessment - 04/15/21 0001       PROM   Overall PROM Comments 105 degrees                           [facility] Adult PT Treatment/Exercise - 04/15/21 0001       Knee/Hip Exercises: Stretches   Active Hamstring Stretch Left;3 reps;20 seconds    Gastroc Stretch 3 reps;20 seconds;Left    Gastroc Stretch Limitations using rockerboard      Knee/Hip Exercises: Aerobic   Recumbent Bike Level 2 x 8 minutes- full revoluations, steady RPM at begining of session      Knee/Hip Exercises: Standing   Lateral Step Up Left;Hand Hold: 1;2 sets;10 reps;Step Height: 8"    Forward Step Up Left;2 sets;10 reps;Step Height: 8"    Wall Squat Limitations;5 reps;2  sets    Wall Squat Limitations 0-40 degrees    Rocker Board 3 minutes    SLS with Vectors Rt leg with slider under (star pattern) x5 each    Other Standing Knee Exercises TKE with green band 2x10      Modalities   Modalities Vasopneumatic      Vasopneumatic   Number Minutes Vasopneumatic  15 minutes    Vasopnuematic Location  Knee    Vasopneumatic Pressure Medium    Vasopneumatic Temperature  3 snowflakes                      PT Short Term Goals - 04/13/21 1021       PT SHORT TERM GOAL #1   Title be independent in initial HEP    Status Achieved      PT SHORT TERM GOAL #3   Title wean from immobilizer and demonstrate quad control and safe gait on all distances    Baseline using hinged brace on Lt    Status Achieved  PT Long Term Goals - 04/06/21 1203       PT LONG TERM GOAL #1   Title be independent in advanced HEP    Time 8    Period Weeks    Status New    Target Date 06/01/21      PT LONG TERM GOAL #2   Title improve FOTO to > or = to 74 to improve functional mobility    Time 8    Period Weeks    Status New    Target Date 06/01/21      PT LONG TERM GOAL #3   Title wean from crutch and immobilizer and demonstrate quad control with level surface ambulation and ascending steps on Lt    Time 8    Period Weeks    Status New    Target Date 06/01/21      PT LONG TERM GOAL #4   Title demonstrate Lt knee A/ROM flexion to > or = to 125 degrees to improve squatting    Time 8    Period Weeks    Target Date 06/01/21      PT LONG TERM GOAL #5   Title demonstrate > or = to 4+/5 Lt quad, hamstring and hip flexor strength to improve endurance and safety in the community    Time 8    Period Weeks    Status New    Target Date 06/01/21      Additional Long Term Goals   Additional Long Term Goals Yes      PT LONG TERM GOAL #6   Title improve Lt quad strength to descend steps with good eccentric control    Time 8    Status New     Target Date 06/01/21                   Plan - 04/15/21 0946     Clinical Impression Statement Pt is independent and complaint in initial HEP.  Pt is doing well with current phase of protocol.  Gait pattern is significantly improved without device.   Pt with improved endurance and reduced quad atrophy today.  Pt demonstrated good single leg stability with vectors and demonstrated less fatigue at the end of 2nd set vs last session.  Pt with full revolutions and added resistance and  time on bike today and demonstrated good quad control with 8" step-ups.  Post-op edema is present in distal quad.  Pt will continue to benefit from skilled PT for safe progression of strength and flexibility s/p ACL repair.    PT Frequency 2x / week    PT Duration 8 weeks    PT Treatment/Interventions ADLs/Self Care Home Management;Cryotherapy;Electrical Stimulation;Moist Heat;Iontophoresis '4mg'$ /ml Dexamethasone;Gait training;Stair training;Functional mobility training;Therapeutic activities;Therapeutic exercise;Balance training;Neuromuscular re-education;Manual techniques;Patient/family education;Passive range of motion;Taping;Vasopneumatic Device    PT Next Visit Plan continue to follow protocol, edema management as needed    PT Home Exercise Plan Access Code: [email or web address]    Consulted and Agree with Plan of Care Patient             Patient will benefit from skilled therapeutic intervention in order to improve the following deficits and impairments:  Abnormal gait, Decreased activity tolerance, Decreased balance, Decreased strength, Increased edema, Impaired flexibility, Decreased scar mobility, Pain, Decreased endurance, Difficulty walking, Decreased range of motion  Visit Diagnosis: Acute pain of left knee  Stiffness of left knee, not elsewhere classified  Other abnormalities of gait and mobility  Localized edema  Muscle weakness (generalized)  Problem List There are no problems to display  for this patient.   [name], PT 04/15/21 10:17 AM   [facility] [address], [place], [place], [address] Phone: [phone]   Fax:  [phone]  Name: Evan Phillips MRN: [id number] Date of Birth: 01/23/1976

## 2021-04-20 ENCOUNTER — Other Ambulatory Visit: Payer: Self-pay

## 2021-04-20 ENCOUNTER — Ambulatory Visit: Payer: BC Managed Care – PPO | Admitting: Physical Therapy

## 2021-04-20 DIAGNOSIS — R600 Localized edema: Secondary | ICD-10-CM

## 2021-04-20 DIAGNOSIS — M25562 Pain in left knee: Secondary | ICD-10-CM

## 2021-04-20 DIAGNOSIS — M25662 Stiffness of left knee, not elsewhere classified: Secondary | ICD-10-CM

## 2021-04-20 DIAGNOSIS — R2689 Other abnormalities of gait and mobility: Secondary | ICD-10-CM

## 2021-04-20 DIAGNOSIS — M6281 Muscle weakness (generalized): Secondary | ICD-10-CM

## 2021-04-20 NOTE — Therapy (Signed)
[facility] [address] [place], [place], [address] Phone: [phone]   Fax:  [phone]  Physical Therapy Treatment  Patient Details  Name: Evan Phillips MRN: [id number] Date of Birth: 1976/06/10 Referring Provider (PT): Harden Mo, MD   Encounter Date: 04/20/2021   PT End of Session - 04/20/21 1057     Visit Number 5    Date for PT Re-Evaluation 06/01/21    Authorization Type BCBS- no visit limit    PT Start Time 1019    PT Stop Time 1101    PT Time Calculation (min) 42 min    Equipment Utilized During Treatment Other (comment)   Lt knee hinged brace   Activity Tolerance Patient tolerated treatment well    Behavior During Therapy [facility] for tasks assessed/performed             Past Medical History:  Diagnosis Date   Allergy    Anxiety    GERD (gastroesophageal reflux disease)    Hyperlipidemia    Hypertension     Past Surgical History:  Procedure Laterality Date   COLONOSCOPY  01/15/2015   Henrene Pastor hyperplastic polyp   KNEE SURGERY     WISDOM TOOTH EXTRACTION      There were no vitals filed for this visit.   Subjective Assessment - 04/20/21 1052     Subjective Has intermittent discomfort and inferior patella.    Pertinent History ACL repair 03/31/21, HTN.   FOLLOW PROTOCOL- in staff office drawer    Patient Stated Goals return to prior level of function, improve strength of Lt LE, walk without device    Currently in Pain? Yes    Pain Score 2     Pain Location Knee    Pain Orientation Left    Pain Descriptors / Indicators Discomfort    Pain Type Surgical pain                               [facility] Adult PT Treatment/Exercise - 04/20/21 0001       Knee/Hip Exercises: Stretches   Active Hamstring Stretch --    Gastroc Stretch --    Gastroc Stretch Limitations --      Knee/Hip Exercises: Aerobic   Recumbent Bike --      Knee/Hip Exercises: Standing   Heel Raises  Both;1 set;15 reps    Lateral Step Up Left;Hand Hold: 1;2 sets;10 reps;Step Height: 8"    Lateral Step Up Limitations cuing for decreased knee valgus    Forward Step Up Left;2 sets;10 reps;Step Height: 8"    Wall Squat Limitations;5 reps;2 sets    Wall Squat Limitations 0-40 degrees    SLS with Vectors slider under Rt foot; x5 rounds in star pattern    Other Standing Knee Exercises TKE with ball 2 x 10 repetitions      Knee/Hip Exercises: Prone   Hamstring Curl 1 set;10 reps    Hamstring Curl Limitations red tband      Modalities   Modalities Vasopneumatic      Vasopneumatic   Number Minutes Vasopneumatic  15 minutes    Vasopnuematic Location  Knee    Vasopneumatic Pressure Medium    Vasopneumatic Temperature  3 snowflakes                      PT Short Term Goals - 04/20/21 1056       PT SHORT  TERM GOAL #2   Title demonstrate good quad set to perform SLR x 5 without lag    Time 4    Period Weeks    Status Achieved    Target Date 05/04/21      PT SHORT TERM GOAL #3   Title wean from immobilizer and demonstrate quad control and safe gait on all distances    Baseline using hinged brace on Lt    Time 4    Period Weeks    Status Achieved    Target Date 05/04/21               PT Long Term Goals - 04/06/21 1203       PT LONG TERM GOAL #1   Title be independent in advanced HEP    Time 8    Period Weeks    Status New    Target Date 06/01/21      PT LONG TERM GOAL #2   Title improve FOTO to > or = to 74 to improve functional mobility    Time 8    Period Weeks    Status New    Target Date 06/01/21      PT LONG TERM GOAL #3   Title wean from crutch and immobilizer and demonstrate quad control with level surface ambulation and ascending steps on Lt    Time 8    Period Weeks    Status New    Target Date 06/01/21      PT LONG TERM GOAL #4   Title demonstrate Lt knee A/ROM flexion to > or = to 125 degrees to improve squatting    Time 8    Period  Weeks    Target Date 06/01/21      PT LONG TERM GOAL #5   Title demonstrate > or = to 4+/5 Lt quad, hamstring and hip flexor strength to improve endurance and safety in the community    Time 8    Period Weeks    Status New    Target Date 06/01/21      Additional Long Term Goals   Additional Long Term Goals Yes      PT LONG TERM GOAL #6   Title improve Lt quad strength to descend steps with good eccentric control    Time 8    Status New    Target Date 06/01/21                   Plan - 04/20/21 1052     Clinical Impression Statement Patient reports intermittent inferior patellar discomfort but overall no pain. Cuing provided for decreased knee valgus when performing lateral step up activity. He demos good eccentric quad control during lowering portion of step up activities. Cuing for decreased Lt LE offloading when performing wall squat iwth patient able to self-correct with good form. He would benefit from continued skilled intervention to address impairments for improved functional mobility.    Personal Factors and Comorbidities Comorbidity 1    Comorbidities ACL repair-LT    Examination-Activity Limitations Locomotion Level;Squat;Stairs;Stand;Transfers    Examination-Participation Restrictions Community Activity;Occupation    Rehab Potential Excellent    PT Frequency 2x / week    PT Duration 8 weeks    PT Treatment/Interventions ADLs/Self Care Home Management;Cryotherapy;Electrical Stimulation;Moist Heat;Iontophoresis '4mg'$ /ml Dexamethasone;Gait training;Stair training;Functional mobility training;Therapeutic activities;Therapeutic exercise;Balance training;Neuromuscular re-education;Manual techniques;Patient/family education;Passive range of motion;Taping;Vasopneumatic Device    PT Next Visit Plan continue protocol; edmema management as needed; maybe 3-way hip with theraband  or other perturbation exercises    PT Home Exercise Plan Access Code: [email or web address]    Consulted and  Agree with Plan of Care Patient             Patient will benefit from skilled therapeutic intervention in order to improve the following deficits and impairments:  Abnormal gait, Decreased activity tolerance, Decreased balance, Decreased strength, Increased edema, Impaired flexibility, Decreased scar mobility, Pain, Decreased endurance, Difficulty walking, Decreased range of motion  Visit Diagnosis: Acute pain of left knee  Stiffness of left knee, not elsewhere classified  Other abnormalities of gait and mobility  Localized edema  Muscle weakness (generalized)     Problem List There are no problems to display for this patient.  Everardo All PT, DPT  04/20/21 11:04 AM     [facility] [address] [place], [place], [address] Phone: [phone]   Fax:  [phone]  Name: Evan Phillips MRN: [id number] Date of Birth: 1976-03-14

## 2021-04-22 ENCOUNTER — Other Ambulatory Visit: Payer: Self-pay

## 2021-04-22 ENCOUNTER — Ambulatory Visit: Payer: BC Managed Care – PPO

## 2021-04-22 DIAGNOSIS — M25662 Stiffness of left knee, not elsewhere classified: Secondary | ICD-10-CM

## 2021-04-22 DIAGNOSIS — M25562 Pain in left knee: Secondary | ICD-10-CM | POA: Diagnosis not present

## 2021-04-22 DIAGNOSIS — R600 Localized edema: Secondary | ICD-10-CM

## 2021-04-22 DIAGNOSIS — R2689 Other abnormalities of gait and mobility: Secondary | ICD-10-CM

## 2021-04-22 DIAGNOSIS — M6281 Muscle weakness (generalized): Secondary | ICD-10-CM

## 2021-04-22 NOTE — Therapy (Signed)
[facility] [address] [place], [place], [address] Phone: [phone]   Fax:  [phone]  Physical Therapy Treatment  Patient Details  Name: Evan Phillips MRN: [id number] Date of Birth: 21-Dec-1975 Referring Provider (PT): Harden Mo, MD   Encounter Date: 04/22/2021   PT End of Session - 04/22/21 1219     Visit Number 6    Date for PT Re-Evaluation 06/01/21    Authorization Type BCBS- no visit limit    PT Start Time 1143    PT Stop Time 1233    PT Time Calculation (min) 50 min    Equipment Utilized During Treatment Other (comment)    Activity Tolerance Patient tolerated treatment well             Past Medical History:  Diagnosis Date   Allergy    Anxiety    GERD (gastroesophageal reflux disease)    Hyperlipidemia    Hypertension     Past Surgical History:  Procedure Laterality Date   COLONOSCOPY  01/15/2015   Henrene Pastor hyperplastic polyp   KNEE SURGERY     WISDOM TOOTH EXTRACTION      There were no vitals filed for this visit.   Subjective Assessment - 04/22/21 1147     Subjective I was on my feet a little more so the Lt LE felt unstable in the shower this morning,    Pertinent History ACL repair 03/31/21, HTN.   FOLLOW PROTOCOL- in staff office drawer    Patient Stated Goals return to prior level of function, improve strength of Lt LE, walk without device    Currently in Pain? No/denies                [facility] PT Assessment - 04/22/21 0001       PROM   Overall PROM Comments 118 degrees                           [facility] Adult PT Treatment/Exercise - 04/22/21 0001       Knee/Hip Exercises: Stretches   Active Hamstring Stretch Left;3 reps;20 seconds      Knee/Hip Exercises: Aerobic   Recumbent Bike Level 2 x 8 minutes- PT present to discuss progress      Knee/Hip Exercises: Standing   Step Down Left;10 reps;Hand Hold: 1;Step Height: 4"    Wall Squat  Limitations;5 reps;2 sets    Wall Squat Limitations 0-60 degrees    SLS with Vectors slider under Rt foot; x5 rounds in star pattern    Walking with Sports Cord 20# forward x 5, 25# x 5, reverse 25# x5, 30# x5      Knee/Hip Exercises: Seated   Hamstring Curl Strengthening;Left;2 sets;20 reps    Hamstring Limitations red loop      Modalities   Modalities Vasopneumatic      Vasopneumatic   Number Minutes Vasopneumatic  15 minutes    Vasopnuematic Location  Knee    Vasopneumatic Pressure Medium    Vasopneumatic Temperature  3 snowflakes                      PT Short Term Goals - 04/22/21 1148       PT SHORT TERM GOAL #4   Title demonstrate 100 degrees of Lt knee flexion active and passively to improve sit to stand and negotiating steps    Status Achieved  PT Long Term Goals - 04/06/21 1203       PT LONG TERM GOAL #1   Title be independent in advanced HEP    Time 8    Period Weeks    Status New    Target Date 06/01/21      PT LONG TERM GOAL #2   Title improve FOTO to > or = to 74 to improve functional mobility    Time 8    Period Weeks    Status New    Target Date 06/01/21      PT LONG TERM GOAL #3   Title wean from crutch and immobilizer and demonstrate quad control with level surface ambulation and ascending steps on Lt    Time 8    Period Weeks    Status New    Target Date 06/01/21      PT LONG TERM GOAL #4   Title demonstrate Lt knee A/ROM flexion to > or = to 125 degrees to improve squatting    Time 8    Period Weeks    Target Date 06/01/21      PT LONG TERM GOAL #5   Title demonstrate > or = to 4+/5 Lt quad, hamstring and hip flexor strength to improve endurance and safety in the community    Time 8    Period Weeks    Status New    Target Date 06/01/21      Additional Long Term Goals   Additional Long Term Goals Yes      PT LONG TERM GOAL #6   Title improve Lt quad strength to descend steps with good eccentric  control    Time 8    Status New    Target Date 06/01/21                   Plan - 04/22/21 1218     Clinical Impression Statement Pt is now 3 weeks post-op and able to move to next phase of post-op protocol. Patient reports intermittent inferior patellar discomfort but overall no pain. Cuing provided for decreased knee valgus when performing lateral step-up activity and with initiation of 4" step down. Pt fatigues quickly with 4" step down. Improved edema overall in the Lt knee and Lt knee flexion A/ROM is 118 today. Pt will benefit from continued skilled intervention to address impairments for improved functional mobility.    PT Treatment/Interventions ADLs/Self Care Home Management;Cryotherapy;Electrical Stimulation;Moist Heat;Iontophoresis '4mg'$ /ml Dexamethasone;Gait training;Stair training;Functional mobility training;Therapeutic activities;Therapeutic exercise;Balance training;Neuromuscular re-education;Manual techniques;Patient/family education;Passive range of motion;Taping;Vasopneumatic Device    PT Next Visit Plan continue protocol- phase 3; edmema management as needed; maybe 3-way hip with theraband or other perturbation exercises    PT Home Exercise Plan Access Code: [email or web address]    Consulted and Agree with Plan of Care Patient             Patient will benefit from skilled therapeutic intervention in order to improve the following deficits and impairments:  Abnormal gait, Decreased activity tolerance, Decreased balance, Decreased strength, Increased edema, Impaired flexibility, Decreased scar mobility, Pain, Decreased endurance, Difficulty walking, Decreased range of motion  Visit Diagnosis: Acute pain of left knee  Stiffness of left knee, not elsewhere classified  Other abnormalities of gait and mobility  Localized edema  Muscle weakness (generalized)     Problem List There are no problems to display for this patient.    [name], PT 04/22/21 12:21 PM   [facility] [address]  [address], [place], [place], [address] Phone: [phone]   Fax:  [phone]  Name: Evan Phillips MRN: [id number] Date of Birth: Mar 18, 1976

## 2021-04-27 ENCOUNTER — Other Ambulatory Visit: Payer: Self-pay

## 2021-04-27 ENCOUNTER — Ambulatory Visit: Payer: BC Managed Care – PPO | Admitting: Physical Therapy

## 2021-04-27 DIAGNOSIS — M25662 Stiffness of left knee, not elsewhere classified: Secondary | ICD-10-CM

## 2021-04-27 DIAGNOSIS — M25562 Pain in left knee: Secondary | ICD-10-CM | POA: Diagnosis not present

## 2021-04-27 DIAGNOSIS — R2689 Other abnormalities of gait and mobility: Secondary | ICD-10-CM

## 2021-04-27 DIAGNOSIS — R600 Localized edema: Secondary | ICD-10-CM

## 2021-04-27 DIAGNOSIS — M6281 Muscle weakness (generalized): Secondary | ICD-10-CM

## 2021-04-27 NOTE — Therapy (Signed)
[facility] [address], [place], [place], [address] Phone: [phone]   Fax:  [phone]  Physical Therapy Treatment  Patient Details  Name: Evan Phillips MRN: [id number] Date of Birth: [birth year] Referring Provider (PT): Harden Mo, MD   Encounter Date: 04/27/2021   PT End of Session - 04/27/21 1342     Visit Number 7    Date for PT Re-Evaluation 06/01/21    Authorization Type BCBS- no visit limit    PT Start Time 1017    PT Stop Time 1100    PT Time Calculation (min) 43 min    Equipment Utilized During Treatment Other (comment)    Activity Tolerance Patient tolerated treatment well    Behavior During Therapy [facility] for tasks assessed/performed             Past Medical History:  Diagnosis Date   Allergy    Anxiety    GERD (gastroesophageal reflux disease)    Hyperlipidemia    Hypertension     Past Surgical History:  Procedure Laterality Date   COLONOSCOPY  01/15/2015   Henrene Pastor hyperplastic polyp   KNEE SURGERY     WISDOM TOOTH EXTRACTION      There were no vitals filed for this visit.   Subjective Assessment - 04/27/21 1333     Subjective Feels pretty good today.    Pertinent History ACL repair 03/31/21, HTN.   FOLLOW PROTOCOL- in staff office drawer    Patient Stated Goals return to prior level of function, improve strength of Lt LE, walk without device    Currently in Pain? No/denies                               [facility] Adult PT Treatment/Exercise - 04/27/21 0001       Knee/Hip Exercises: Stretches   Active Hamstring Stretch Left;3 reps;20 seconds    Active Hamstring Stretch Limitations seated      Knee/Hip Exercises: Aerobic   Recumbent Bike L3 x 8 minutes; PT present to assess response and discuss progress      Knee/Hip Exercises: Standing   Step Down Left;Hand Hold: 1;Step Height: 4"   2 x 7 repetitions   Wall Squat Limitations    Wall Squat Limitations  0-60 degrees   2 x 8 repetitions   SLS with Vectors slider under Rt foot; x7 rounds in star pattern using wobble stick for UE support    Walking with Sports Cord 25# forward x 8; x5 reverse x25, x5 30#      Modalities   Modalities Vasopneumatic      Vasopneumatic   Number Minutes Vasopneumatic  15 minutes    Vasopnuematic Location  Knee    Vasopneumatic Pressure Medium    Vasopneumatic Temperature  3 snowflakes                      PT Short Term Goals - 04/22/21 1148       PT SHORT TERM GOAL #4   Title demonstrate 100 degrees of Lt knee flexion active and passively to improve sit to stand and negotiating steps    Status Achieved               PT Long Term Goals - 04/27/21 1342       PT LONG TERM GOAL #4   Title demonstrate Lt knee A/ROM flexion to > or = to  125 degrees to improve squatting    Time 8    Period Weeks    Status Achieved   125 degrees this date                  Plan - 04/27/21 1333     Clinical Impression Statement Patient demos some valgus with step down activity as well as Rt hip drop. Unable to fully correct despite cuing. Flexion AROM improved to 125 as compared to 118 last session. He would benefit from continued skilled intervention along current POC.    Personal Factors and Comorbidities Comorbidity 1    Comorbidities ACL repair-LT    Examination-Activity Limitations Locomotion Level;Squat;Stairs;Stand;Transfers    Examination-Participation Restrictions Community Activity;Occupation    Rehab Potential Excellent    PT Frequency 2x / week    PT Duration 8 weeks    PT Treatment/Interventions ADLs/Self Care Home Management;Cryotherapy;Electrical Stimulation;Moist Heat;Iontophoresis '4mg'$ /ml Dexamethasone;Gait training;Stair training;Functional mobility training;Therapeutic activities;Therapeutic exercise;Balance training;Neuromuscular re-education;Manual techniques;Patient/family education;Passive range of  motion;Taping;Vasopneumatic Device    PT Next Visit Plan continue protocol- phase 3; edmema management as needed    PT Home Exercise Plan Access Code: [email or web address]    Consulted and Agree with Plan of Care Patient             Patient will benefit from skilled therapeutic intervention in order to improve the following deficits and impairments:  Abnormal gait, Decreased activity tolerance, Decreased balance, Decreased strength, Increased edema, Impaired flexibility, Decreased scar mobility, Pain, Decreased endurance, Difficulty walking, Decreased range of motion  Visit Diagnosis: Acute pain of left knee  Stiffness of left knee, not elsewhere classified  Other abnormalities of gait and mobility  Localized edema  Muscle weakness (generalized)     Problem List There are no problems to display for this patient.   Everardo All PT, DPT 04/27/21 1:47 PM   [facility] [address], [place], [place], [address] Phone: [phone]   Fax:  [phone]  Name: Evan Phillips MRN: [id number] Date of Birth: [birth year]

## 2021-04-29 ENCOUNTER — Ambulatory Visit: Payer: BC Managed Care – PPO

## 2021-04-29 ENCOUNTER — Other Ambulatory Visit: Payer: Self-pay

## 2021-04-29 DIAGNOSIS — M25562 Pain in left knee: Secondary | ICD-10-CM | POA: Diagnosis not present

## 2021-04-29 DIAGNOSIS — M6281 Muscle weakness (generalized): Secondary | ICD-10-CM

## 2021-04-29 DIAGNOSIS — R600 Localized edema: Secondary | ICD-10-CM

## 2021-04-29 DIAGNOSIS — R2689 Other abnormalities of gait and mobility: Secondary | ICD-10-CM

## 2021-04-29 DIAGNOSIS — M25662 Stiffness of left knee, not elsewhere classified: Secondary | ICD-10-CM

## 2021-04-29 NOTE — Therapy (Signed)
[facility] [address], [place], [place], [address] Phone: [phone]   Fax:  [phone]  Physical Therapy Treatment  Patient Details  Name: Evan Phillips MRN: [id number] Date of Birth: [birth year] Referring Provider (PT): Harden Mo, MD   Encounter Date: 04/29/2021   PT End of Session - 04/29/21 1137     Visit Number 8    Date for PT Re-Evaluation 06/01/21    Authorization Type BCBS- no visit limit    PT Start Time 1102    PT Stop Time [id number]    PT Time Calculation (min) 34 min    Activity Tolerance Patient tolerated treatment well             Past Medical History:  Diagnosis Date   Allergy    Anxiety    GERD (gastroesophageal reflux disease)    Hyperlipidemia    Hypertension     Past Surgical History:  Procedure Laterality Date   COLONOSCOPY  01/15/2015   Henrene Pastor hyperplastic polyp   KNEE SURGERY     WISDOM TOOTH EXTRACTION      There were no vitals filed for this visit.   Subjective Assessment - 04/29/21 1102     Subjective Feeling good.  Still wearing brace consistently.    Pertinent History ACL repair 03/31/21, HTN.   FOLLOW PROTOCOL- in staff office drawer    Currently in Pain? No/denies                               [facility] Adult PT Treatment/Exercise - 04/29/21 0001       Knee/Hip Exercises: Stretches   Active Hamstring Stretch Left;3 reps;20 seconds    Active Hamstring Stretch Limitations seated      Knee/Hip Exercises: Aerobic   Recumbent Bike L3 x 8 minutes; PT present to assess response and discuss progress      Knee/Hip Exercises: Standing   Step Down Left;Hand Hold: 1;Step Height: 4";2 sets;10 reps    Wall Squat Limitations    Wall Squat Limitations 0-60 degrees   2x10 holding 5# weights in each hand   SLS level surface: 3x 10 seconds    SLS with Vectors slider under Rt foot; x7 rounds in star pattern using wobble stick for UE support    Walking  with Sports Cord 25# forward x 10; x5 reverse 30# x 10, sidestepping 20# x 5 each      Vasopneumatic   Number Minutes Vasopneumatic  --   didn't do today due to no edema                     PT Short Term Goals - 04/22/21 1148       PT SHORT TERM GOAL #4   Title demonstrate 100 degrees of Lt knee flexion active and passively to improve sit to stand and negotiating steps    Status Achieved               PT Long Term Goals - 04/27/21 1342       PT LONG TERM GOAL #4   Title demonstrate Lt knee A/ROM flexion to > or = to 125 degrees to improve squatting    Time 8    Period Weeks    Status Achieved   125 degrees this date                  Plan - 04/29/21  1126     Clinical Impression Statement Patient demos some valgus with step down activity as well as Rt hip drop. PT provided verbal and tactile cueing to improve alignment.  Lt knee ROM is improved this week.  Pt tolerated addition of reps and weight with resisted walking and stability was good with sidestepping today. Edema has been minimal this week.  He would benefit from continued skilled intervention along current POC for safe post-op progression of activity.    PT Treatment/Interventions ADLs/Self Care Home Management;Cryotherapy;Electrical Stimulation;Moist Heat;Iontophoresis '4mg'$ /ml Dexamethasone;Gait training;Stair training;Functional mobility training;Therapeutic activities;Therapeutic exercise;Balance training;Neuromuscular re-education;Manual techniques;Patient/family education;Passive range of motion;Taping;Vasopneumatic Device    PT Next Visit Plan continue protocol- phase 3; edmema management as needed    PT Home Exercise Plan Access Code: [email or web address]    Recommended Other Services initial cert is signed    Consulted and Agree with Plan of Care Patient             Patient will benefit from skilled therapeutic intervention in order to improve the following deficits and impairments:  Abnormal  gait, Decreased activity tolerance, Decreased balance, Decreased strength, Increased edema, Impaired flexibility, Decreased scar mobility, Pain, Decreased endurance, Difficulty walking, Decreased range of motion  Visit Diagnosis: Acute pain of left knee  Stiffness of left knee, not elsewhere classified  Other abnormalities of gait and mobility  Localized edema  Muscle weakness (generalized)     Problem List There are no problems to display for this patient.   Evan Phillips, PT 04/29/21 11:39 AM   [facility] [address] [place], [place], [address] Phone: [phone]   Fax:  [phone]  Name: Evan Phillips MRN: [id number] Date of Birth: February 28, 1976

## 2021-05-04 ENCOUNTER — Ambulatory Visit: Payer: BC Managed Care – PPO

## 2021-05-04 ENCOUNTER — Other Ambulatory Visit: Payer: Self-pay

## 2021-05-04 DIAGNOSIS — M6281 Muscle weakness (generalized): Secondary | ICD-10-CM

## 2021-05-04 DIAGNOSIS — M25662 Stiffness of left knee, not elsewhere classified: Secondary | ICD-10-CM

## 2021-05-04 DIAGNOSIS — M25562 Pain in left knee: Secondary | ICD-10-CM

## 2021-05-04 DIAGNOSIS — R2689 Other abnormalities of gait and mobility: Secondary | ICD-10-CM

## 2021-05-04 DIAGNOSIS — R600 Localized edema: Secondary | ICD-10-CM

## 2021-05-04 NOTE — Therapy (Signed)
[facility] [address] [place], [place], [address] Phone: [phone]   Fax:  [phone]  Physical Therapy Treatment  Patient Details  Name: Evan Phillips MRN: [id number] Date of Birth: [birth year] Referring Provider (PT): Harden Mo, MD   Encounter Date: 05/04/2021   PT End of Session - 05/04/21 1056     Visit Number 9    Date for PT Re-Evaluation 06/01/21    Authorization Type BCBS- no visit limit    PT Start Time 1018    PT Stop Time 1056    PT Time Calculation (min) 38 min    Activity Tolerance Patient tolerated treatment well    Behavior During Therapy [facility] for tasks assessed/performed             Past Medical History:  Diagnosis Date   Allergy    Anxiety    GERD (gastroesophageal reflux disease)    Hyperlipidemia    Hypertension     Past Surgical History:  Procedure Laterality Date   COLONOSCOPY  01/15/2015   Henrene Pastor hyperplastic polyp   KNEE SURGERY     WISDOM TOOTH EXTRACTION      There were no vitals filed for this visit.   Subjective Assessment - 05/04/21 1012     Subjective I didn't notice a difference with not doing the ice last time.    Pertinent History ACL repair 03/31/21, HTN.   FOLLOW PROTOCOL- in staff office drawer    Patient Stated Goals return to prior level of function, improve strength of Lt LE, walk without device    Currently in Pain? No/denies                               [facility] Adult PT Treatment/Exercise - 05/04/21 0001       Knee/Hip Exercises: Stretches   Active Hamstring Stretch Left;3 reps;20 seconds    Active Hamstring Stretch Limitations seated      Knee/Hip Exercises: Aerobic   Recumbent Bike L4 x 8 minutes; PT present to assess response and discuss progress      Knee/Hip Exercises: Standing   Step Down Left;Hand Hold: 1;Step Height: 4";2 sets;10 reps    Wall Squat Limitations    Wall Squat Limitations 0-60 degrees   2x10  holding 5# weights in each hand   SLS level surface: 1x20 seconds, green pod 2x20 seconds    Walking with Sports Cord 30# forward x 10; x5 reverse 30# x 10, sidestepping 20# x 10 each      Knee/Hip Exercises: Seated   Sit to Sand 2 sets;10 reps;without UE support   holding 10# kettle bell                     PT Short Term Goals - 04/22/21 1148       PT SHORT TERM GOAL #4   Title demonstrate 100 degrees of Lt knee flexion active and passively to improve sit to stand and negotiating steps    Status Achieved               PT Long Term Goals - 04/27/21 1342       PT LONG TERM GOAL #4   Title demonstrate Lt knee A/ROM flexion to > or = to 125 degrees to improve squatting    Time 8    Period Weeks    Status Achieved   125 degrees this date  Plan - 05/04/21 1052     Clinical Impression Statement Pt with improved Lt knee stability with step downs and tolerated 6" step well today. Pt demonstrated fatigue with 2nd set.  Pt with Lt LE stability with single leg stance on level surface and green pod was added for additional challenge.  Pt continues to report increased endurance and reported increased stability with descending steps at home.  Edema has been minimal this week and no longer requires ice at home or post-treatment in the clinic.  He would benefit from continued skilled intervention along current POC for safe post-op progression of activity.    PT Frequency 2x / week    PT Duration 8 weeks    PT Treatment/Interventions ADLs/Self Care Home Management;Cryotherapy;Electrical Stimulation;Moist Heat;Iontophoresis '4mg'$ /ml Dexamethasone;Gait training;Stair training;Functional mobility training;Therapeutic activities;Therapeutic exercise;Balance training;Neuromuscular re-education;Manual techniques;Patient/family education;Passive range of motion;Taping;Vasopneumatic Device    PT Next Visit Plan continue protocol- phase 3; edmema management as needed     PT Home Exercise Plan Access Code: [email or web address]    Consulted and Agree with Plan of Care Patient             Patient will benefit from skilled therapeutic intervention in order to improve the following deficits and impairments:  Abnormal gait, Decreased activity tolerance, Decreased balance, Decreased strength, Increased edema, Impaired flexibility, Decreased scar mobility, Pain, Decreased endurance, Difficulty walking, Decreased range of motion  Visit Diagnosis: Acute pain of left knee  Stiffness of left knee, not elsewhere classified  Other abnormalities of gait and mobility  Localized edema  Muscle weakness (generalized)     Problem List There are no problems to display for this patient.   [name], PT 05/04/21 11:07 AM   [facility] [address], [place], [place], [address] Phone: [phone]   Fax:  [phone]  Name: Evan Phillips MRN: [id number] Date of Birth: November 30, 1975

## 2021-05-06 ENCOUNTER — Ambulatory Visit: Payer: BC Managed Care – PPO | Attending: Orthopedic Surgery

## 2021-05-06 ENCOUNTER — Other Ambulatory Visit: Payer: Self-pay

## 2021-05-06 DIAGNOSIS — R2689 Other abnormalities of gait and mobility: Secondary | ICD-10-CM | POA: Diagnosis present

## 2021-05-06 DIAGNOSIS — M25562 Pain in left knee: Secondary | ICD-10-CM | POA: Diagnosis present

## 2021-05-06 DIAGNOSIS — R600 Localized edema: Secondary | ICD-10-CM | POA: Diagnosis present

## 2021-05-06 DIAGNOSIS — M6281 Muscle weakness (generalized): Secondary | ICD-10-CM | POA: Diagnosis present

## 2021-05-06 DIAGNOSIS — M25662 Stiffness of left knee, not elsewhere classified: Secondary | ICD-10-CM | POA: Insufficient documentation

## 2021-05-06 NOTE — Therapy (Signed)
[facility] [address] [place], [place], [address] Phone: [phone]   Fax:  [phone]  Physical Therapy Treatment  Patient Details  Name: Evan Phillips MRN: [id number] Date of Birth: 1976/03/22 Referring Provider (PT): Harden Mo, MD   Encounter Date: 05/06/2021   PT End of Session - 05/06/21 1053     Visit Number 10    Date for PT Re-Evaluation 06/01/21    Authorization Type BCBS- no visit limit    PT Start Time 1018    PT Stop Time [id number]    PT Time Calculation (min) 34 min    Activity Tolerance Patient tolerated treatment well    Behavior During Therapy [facility] for tasks assessed/performed             Past Medical History:  Diagnosis Date   Allergy    Anxiety    GERD (gastroesophageal reflux disease)    Hyperlipidemia    Hypertension     Past Surgical History:  Procedure Laterality Date   COLONOSCOPY  01/15/2015   Henrene Pastor hyperplastic polyp   KNEE SURGERY     WISDOM TOOTH EXTRACTION      There were no vitals filed for this visit.   Subjective Assessment - 05/06/21 1022     Subjective I am doing well.    Pertinent History ACL repair 03/31/21, HTN.   FOLLOW PROTOCOL- in staff office drawer    Patient Stated Goals return to prior level of function, improve strength of Lt LE, walk without device    Currently in Pain? No/denies                               [facility] Adult PT Treatment/Exercise - 05/06/21 0001       Knee/Hip Exercises: Stretches   Active Hamstring Stretch Left;3 reps;20 seconds    Active Hamstring Stretch Limitations seated      Knee/Hip Exercises: Aerobic   Recumbent Bike L4 x 8 minutes; PT present to assess response and discuss progress      Knee/Hip Exercises: Standing   Step Down Left;Hand Hold: 1;Step Height: 4";2 sets;10 reps    Wall Squat Limitations    Wall Squat Limitations 0-60 degrees   2x10 holding 5# weights in each hand   SLS level  surface: 1x20 seconds, green pod 2x20 seconds    Walking with Sports Cord 30# forward x 10; x5 reverse 30# x 10, sidestepping 20# x 10 each    Other Standing Knee Exercises single leg stance on green pod with tap to low cone with Rt LE: forward, lateral and reverse x 10 each      Knee/Hip Exercises: Seated   Sit to Sand 2 sets;10 reps;without UE support   holding 10# kettle bell                     PT Short Term Goals - 04/22/21 1148       PT SHORT TERM GOAL #4   Title demonstrate 100 degrees of Lt knee flexion active and passively to improve sit to stand and negotiating steps    Status Achieved               PT Long Term Goals - 04/27/21 1342       PT LONG TERM GOAL #4   Title demonstrate Lt knee A/ROM flexion to > or = to 125 degrees to improve squatting  Time 8    Period Weeks    Status Achieved   125 degrees this date                  Plan - 05/06/21 1052     Clinical Impression Statement Pt with improved Lt knee stability with step downs and tolerated 6" step well this week.  Pt required verbal cues for glute med activation for alignment.  Pt demonstrated fatigue with 2nd set.  Pt with Lt LE stability with single leg stance on level surface and green pod was added for additional challenge. Pt did well with advancement in weights with resisted walking.   Pt continues to report increased endurance and reported increased stability with descending steps at home.  He would benefit from continued skilled intervention along current POC for safe post-op progression of activity.    PT Frequency 2x / week    PT Duration 8 weeks    PT Treatment/Interventions ADLs/Self Care Home Management;Cryotherapy;Electrical Stimulation;Moist Heat;Iontophoresis '4mg'$ /ml Dexamethasone;Gait training;Stair training;Functional mobility training;Therapeutic activities;Therapeutic exercise;Balance training;Neuromuscular re-education;Manual techniques;Patient/family education;Passive  range of motion;Taping;Vasopneumatic Device    PT Next Visit Plan continue protocol- phase 3; edmema management as needed    PT Home Exercise Plan Access Code: [email or web address]    Consulted and Agree with Plan of Care Patient             Patient will benefit from skilled therapeutic intervention in order to improve the following deficits and impairments:  Abnormal gait, Decreased activity tolerance, Decreased balance, Decreased strength, Increased edema, Impaired flexibility, Decreased scar mobility, Pain, Decreased endurance, Difficulty walking, Decreased range of motion  Visit Diagnosis: Stiffness of left knee, not elsewhere classified  Acute pain of left knee  Other abnormalities of gait and mobility  Localized edema  Muscle weakness (generalized)     Problem List There are no problems to display for this patient.    [name], PT 05/06/21 10:56 AM  [facility] [address] [place], [place], [address] Phone: [phone]   Fax:  [phone]  Name: YECHESKEL PERNICK MRN: [id number] Date of Birth: 1976-05-19

## 2021-05-11 ENCOUNTER — Other Ambulatory Visit: Payer: Self-pay

## 2021-05-11 ENCOUNTER — Ambulatory Visit: Payer: BC Managed Care – PPO

## 2021-05-11 DIAGNOSIS — M25662 Stiffness of left knee, not elsewhere classified: Secondary | ICD-10-CM | POA: Diagnosis not present

## 2021-05-11 DIAGNOSIS — M25562 Pain in left knee: Secondary | ICD-10-CM

## 2021-05-11 DIAGNOSIS — M6281 Muscle weakness (generalized): Secondary | ICD-10-CM

## 2021-05-11 DIAGNOSIS — R2689 Other abnormalities of gait and mobility: Secondary | ICD-10-CM

## 2021-05-11 DIAGNOSIS — R600 Localized edema: Secondary | ICD-10-CM

## 2021-05-11 NOTE — Therapy (Signed)
[facility] [address], [place], [place], [address] Phone: [phone]   Fax:  [phone]  Physical Therapy Treatment  Patient Details  Name: Evan Phillips MRN: [id number] Date of Birth: 1976-06-10 Referring Provider (PT): Harden Mo, MD   Encounter Date: 05/11/2021   PT End of Session - 05/11/21 1045     Visit Number 11    Date for PT Re-Evaluation 06/01/21    Authorization Type BCBS- no visit limit    PT Start Time 1014    PT Stop Time 1048    PT Time Calculation (min) 34 min    Activity Tolerance Patient tolerated treatment well    Behavior During Therapy [facility] for tasks assessed/performed             Past Medical History:  Diagnosis Date   Allergy    Anxiety    GERD (gastroesophageal reflux disease)    Hyperlipidemia    Hypertension     Past Surgical History:  Procedure Laterality Date   COLONOSCOPY  01/15/2015   Henrene Pastor hyperplastic polyp   KNEE SURGERY     WISDOM TOOTH EXTRACTION      There were no vitals filed for this visit.   Subjective Assessment - 05/11/21 1016     Subjective Doing well with exercises.    Pertinent History ACL repair 03/31/21, HTN.   FOLLOW PROTOCOL- in staff office drawer    Patient Stated Goals return to prior level of function, improve strength of Lt LE, walk without device    Currently in Pain? No/denies                [facility] PT Assessment - 05/11/21 0001       Assessment   Medical Diagnosis s/p Lt ACL reconstruction    Referring Provider (PT) Harden Mo, MD    Onset Date/Surgical Date 03/31/21      Cognition   Overall Cognitive Status Within Functional Limits for tasks assessed      PROM   Overall PROM  Deficits    Overall PROM Comments 130      Strength   Overall Strength Comments Lt quad 4/5 with good quad set and medial firing.  Lt hamstring 5/5                           [facility] Adult PT Treatment/Exercise - 05/11/21  0001       Knee/Hip Exercises: Stretches   Active Hamstring Stretch Left;3 reps;20 seconds    Active Hamstring Stretch Limitations standing on steps      Knee/Hip Exercises: Aerobic   Recumbent Bike L4 x 8 minutes; PT present to assess response and discuss progress      Knee/Hip Exercises: Standing   Step Down Left;Hand Hold: 1;2 sets;10 reps;Step Height: 6"    Wall Squat Limitations    Wall Squat Limitations 0-60 degrees   2x10 holding 7# weights in each hand   SLS level surface: 1x20 seconds, green pod 2x20 seconds    Walking with Sports Cord 30# forward x 10; x5 reverse 30# x 10, sidestepping 25# x 10 each    Other Standing Knee Exercises single leg stance on green pod with tap to low cone with Rt LE: forward, lateral and reverse x 10 each                      PT Short Term Goals - 04/22/21 1148  PT SHORT TERM GOAL #4   Title demonstrate 100 degrees of Lt knee flexion active and passively to improve sit to stand and negotiating steps    Status Achieved               PT Long Term Goals - 05/11/21 1025       PT LONG TERM GOAL #1   Title be independent in advanced HEP    Baseline further advancenent allowed next visit.    Status On-going      PT LONG TERM GOAL #3   Title wean from crutch and immobilizer and demonstrate quad control with level surface ambulation and ascending steps on Lt    Baseline wearing brace for all standing/walking                   Plan - 05/11/21 1056     Clinical Impression Statement Pt denies any functional limitations at this time.  He continues to wear his brace for all standing and walking and has not attempted any running or sport activity per protocol precautions. Pt with improved Lt knee stability with step downs on 6" step.  Pt required intermittent and minor verbal cues for glute med activation for alignment.  Pt demonstrated less fatigue with 2nd set.  Pt with Lt LE stability with single leg stance on level  surface and green pod continues to provided appropriate challenge. Pt continues to report increased endurance and reported increased stability with descending steps at home.  He would benefit from continued skilled intervention along current POC for safe post-op progression of activity.    PT Frequency 2x / week    PT Duration 8 weeks    PT Treatment/Interventions ADLs/Self Care Home Management;Cryotherapy;Electrical Stimulation;Moist Heat;Iontophoresis '4mg'$ /ml Dexamethasone;Gait training;Stair training;Functional mobility training;Therapeutic activities;Therapeutic exercise;Balance training;Neuromuscular re-education;Manual techniques;Patient/family education;Passive range of motion;Taping;Vasopneumatic Device    PT Next Visit Plan continue protocol- phase 3; edmema management as needed    PT Home Exercise Plan Access Code: [email or web address]    Consulted and Agree with Plan of Care Patient             Patient will benefit from skilled therapeutic intervention in order to improve the following deficits and impairments:  Abnormal gait, Decreased activity tolerance, Decreased balance, Decreased strength, Increased edema, Impaired flexibility, Decreased scar mobility, Pain, Decreased endurance, Difficulty walking, Decreased range of motion  Visit Diagnosis: Stiffness of left knee, not elsewhere classified  Acute pain of left knee  Other abnormalities of gait and mobility  Localized edema  Muscle weakness (generalized)     Problem List There are no problems to display for this patient.   [name], PT 05/11/21 10:57 AM   [facility] [address], [place], [place], [address] Phone: [phone]   Fax:  [phone]  Name: Evan Phillips MRN: [id number] Date of Birth: 1976/07/18

## 2021-05-13 ENCOUNTER — Ambulatory Visit: Payer: BC Managed Care – PPO

## 2021-05-13 ENCOUNTER — Other Ambulatory Visit: Payer: Self-pay

## 2021-05-13 DIAGNOSIS — M25562 Pain in left knee: Secondary | ICD-10-CM

## 2021-05-13 DIAGNOSIS — M6281 Muscle weakness (generalized): Secondary | ICD-10-CM

## 2021-05-13 DIAGNOSIS — M25662 Stiffness of left knee, not elsewhere classified: Secondary | ICD-10-CM

## 2021-05-13 DIAGNOSIS — R2689 Other abnormalities of gait and mobility: Secondary | ICD-10-CM

## 2021-05-13 NOTE — Therapy (Signed)
[facility] [address] [place], [place], [address] Phone: [phone]   Fax:  [phone]  Physical Therapy Treatment  Patient Details  Name: Evan Phillips MRN: [id number] Date of Birth: [birth year] Referring Provider (PT): Harden Mo, MD   Encounter Date: 05/13/2021   PT End of Session - 05/13/21 1059     Visit Number 12    Date for PT Re-Evaluation 06/01/21    Authorization Type BCBS- no visit limit    PT Start Time 1017    PT Stop Time 1050    PT Time Calculation (min) 33 min    Activity Tolerance Patient tolerated treatment well    Behavior During Therapy [facility] for tasks assessed/performed             Past Medical History:  Diagnosis Date   Allergy    Anxiety    GERD (gastroesophageal reflux disease)    Hyperlipidemia    Hypertension     Past Surgical History:  Procedure Laterality Date   COLONOSCOPY  01/15/2015   Henrene Pastor hyperplastic polyp   KNEE SURGERY     WISDOM TOOTH EXTRACTION      There were no vitals filed for this visit.   Subjective Assessment - 05/13/21 1043     Subjective Saw MD.  He just talked to me.  Told me that I could take off my brace when my Lt LE is 80% of the Rt.    Pertinent History ACL repair 03/31/21, HTN.   FOLLOW PROTOCOL- in staff office drawer    Currently in Pain? No/denies                               [facility] Adult PT Treatment/Exercise - 05/13/21 0001       Knee/Hip Exercises: Stretches   Active Hamstring Stretch Left;3 reps;20 seconds    Active Hamstring Stretch Limitations standing on steps      Knee/Hip Exercises: Aerobic   Recumbent Bike L4 x 8 minutes; PT present to assess response and discuss progress      Knee/Hip Exercises: Standing   Step Down Left;Hand Hold: 1;2 sets;10 reps;Step Height: 6"    Wall Squat Limitations    Wall Squat Limitations 90 degrees-8# 2x10    SLS with ball toss: 3x10 (unweighted ball)    Rebounder  weight shifting 3 ways x 1 min,  SLS on Lt 5x10 seconds    Walking with Sports Cord 35# forward x 10; x5 reverse 30# x 10, sidestepping      Knee/Hip Exercises: Seated   Sit to Sand 2 sets;10 reps;without UE support   holding 10# kettle bell                      PT Short Term Goals - 04/22/21 1148       PT SHORT TERM GOAL #4   Title demonstrate 100 degrees of Lt knee flexion active and passively to improve sit to stand and negotiating steps    Status Achieved               PT Long Term Goals - 05/11/21 1025       PT LONG TERM GOAL #1   Title be independent in advanced HEP    Baseline further advancenent allowed next visit.    Status On-going      PT LONG TERM GOAL #3   Title wean from crutch  and immobilizer and demonstrate quad control with level surface ambulation and ascending steps on Lt    Baseline wearing brace for all standing/walking                   Plan - 05/13/21 1041     Clinical Impression Statement Pt denies any functional limitations at this time.  He continues to wear his brace for all standing and walking and has not attempted any running or sport activity per protocol precautions. He saw MD yesterday and MD suggested that he wait until Lt LE is at 80% strength to remove the brace.  Pt was able to advance to 90 degrees with wall squat and did well with single limb stability on rebounder and on level surface with ball toss.  Eccentric strength is limited with slider activity, pt fatigues quickly and requires cueing for glute medius activation intermittently.  Pt continues to report increased endurance and reported increased stability with descending steps at home.  He would benefit from continued skilled intervention along current POC for safe post-op progression of activity.    PT Treatment/Interventions ADLs/Self Care Home Management;Cryotherapy;Electrical Stimulation;Moist Heat;Iontophoresis '4mg'$ /ml Dexamethasone;Gait training;Stair  training;Functional mobility training;Therapeutic activities;Therapeutic exercise;Balance training;Neuromuscular re-education;Manual techniques;Patient/family education;Passive range of motion;Taping;Vasopneumatic Device    PT Next Visit Plan continue protocol- phase 3; edmema management as needed.  Can advanced to agility in 8 weeks (05/26/21) and lunges at 7 weeks (05/19/21)    PT Home Exercise Plan Access Code: [email or web address]    Consulted and Agree with Plan of Care Patient             Patient will benefit from skilled therapeutic intervention in order to improve the following deficits and impairments:  Abnormal gait, Decreased activity tolerance, Decreased balance, Decreased strength, Increased edema, Impaired flexibility, Decreased scar mobility, Pain, Decreased endurance, Difficulty walking, Decreased range of motion  Visit Diagnosis: Stiffness of left knee, not elsewhere classified  Acute pain of left knee  Other abnormalities of gait and mobility  Muscle weakness (generalized)     Problem List There are no problems to display for this patient.  [name], PT 05/13/21 11:01 AM   [facility] [address] [place], [place], [address] Phone: [phone]   Fax:  [phone]  Name: Evan Phillips MRN: [id number] Date of Birth: 1976/01/23

## 2021-05-20 ENCOUNTER — Other Ambulatory Visit: Payer: Self-pay

## 2021-05-20 ENCOUNTER — Ambulatory Visit: Payer: BC Managed Care – PPO

## 2021-05-20 DIAGNOSIS — M6281 Muscle weakness (generalized): Secondary | ICD-10-CM

## 2021-05-20 DIAGNOSIS — R600 Localized edema: Secondary | ICD-10-CM

## 2021-05-20 DIAGNOSIS — M25662 Stiffness of left knee, not elsewhere classified: Secondary | ICD-10-CM

## 2021-05-20 DIAGNOSIS — M25562 Pain in left knee: Secondary | ICD-10-CM

## 2021-05-20 DIAGNOSIS — R2689 Other abnormalities of gait and mobility: Secondary | ICD-10-CM

## 2021-05-20 NOTE — Therapy (Signed)
[facility] [address] [place], [place], [address] Phone: [phone]   Fax:  [phone]  Physical Therapy Treatment  Patient Details  Name: QUADARIUS LIPUMA MRN: [id number] Date of Birth: 15-Jan-1976 Referring Provider (PT): Harden Mo, MD   Encounter Date: 05/20/2021   PT End of Session - 05/20/21 1054     Visit Number 13    Date for PT Re-Evaluation 06/01/21    Authorization Type BCBS- no visit limit    PT Start Time 1016    PT Stop Time 1051    PT Time Calculation (min) 35 min    Activity Tolerance Patient tolerated treatment well    Behavior During Therapy [facility] for tasks assessed/performed             Past Medical History:  Diagnosis Date   Allergy    Anxiety    GERD (gastroesophageal reflux disease)    Hyperlipidemia    Hypertension     Past Surgical History:  Procedure Laterality Date   COLONOSCOPY  01/15/2015   Henrene Pastor hyperplastic polyp   KNEE SURGERY     WISDOM TOOTH EXTRACTION      There were no vitals filed for this visit.   Subjective Assessment - 05/20/21 1021     Subjective I got shingles last week on my face.    Pertinent History ACL repair 03/31/21, HTN.   FOLLOW PROTOCOL- in staff office drawer    Patient Stated Goals return to prior level of function, improve strength of Lt LE, walk without device    Currently in Pain? No/denies                               [facility] Adult PT Treatment/Exercise - 05/20/21 0001       Knee/Hip Exercises: Stretches   Active Hamstring Stretch Left;3 reps;20 seconds    Active Hamstring Stretch Limitations standing on steps      Knee/Hip Exercises: Aerobic   Recumbent Bike L4 x 8 minutes; PT present to assess response and discuss progress      Knee/Hip Exercises: Standing   Forward Lunges Left;2 sets;10 reps    Forward Lunges Limitations with countertop support    Step Down Left;Hand Hold: 1;2 sets;10 reps;Step Height:  6"    Wall Squat Limitations    Wall Squat Limitations 90 degrees-8# 2x10    SLS with ball toss: 3x10 (unweighted ball)    Rebounder weight shifting 3 ways x 1 min,  SLS on Lt 5x10 seconds    Walking with Sports Cord 35# forward x 10; x5 reverse 30# x 10, sidestepping      Knee/Hip Exercises: Seated   Sit to Sand 2 sets;10 reps;without UE support   holding 10# kettle bell                      PT Short Term Goals - 04/22/21 1148       PT SHORT TERM GOAL #4   Title demonstrate 100 degrees of Lt knee flexion active and passively to improve sit to stand and negotiating steps    Status Achieved               PT Long Term Goals - 05/11/21 1025       PT LONG TERM GOAL #1   Title be independent in advanced HEP    Baseline further advancenent allowed next visit.  Status On-going      PT LONG TERM GOAL #3   Title wean from crutch and immobilizer and demonstrate quad control with level surface ambulation and ascending steps on Lt    Baseline wearing brace for all standing/walking                   Plan - 05/20/21 1033     Clinical Impression Statement Pt denies any functional limitations at this time.  He continues to wear his brace for all standing and walking and has not attempted any running or sport activity per protocol precautions. Pt is doing well with advancement of protocol including deeper squats and lunges.   Eccentric strength is limited with slider activity, pt fatigues quickly and requires cueing for glute medius activation intermittently.  Pt continues to report increased endurance and reported increased stability with descending steps at home.  He would benefit from continued skilled intervention along current POC for safe post-op progression of activity.    PT Frequency 2x / week    PT Duration 8 weeks    PT Treatment/Interventions ADLs/Self Care Home Management;Cryotherapy;Electrical Stimulation;Moist Heat;Iontophoresis '4mg'$ /ml  Dexamethasone;Gait training;Stair training;Functional mobility training;Therapeutic activities;Therapeutic exercise;Balance training;Neuromuscular re-education;Manual techniques;Patient/family education;Passive range of motion;Taping;Vasopneumatic Device    PT Next Visit Plan continue protocol- phase 3; edmema management as needed.  Can advanced to agility in 8 weeks (05/26/21)    PT Home Exercise Plan Access Code: [email or web address]    Consulted and Agree with Plan of Care Patient             Patient will benefit from skilled therapeutic intervention in order to improve the following deficits and impairments:  Abnormal gait, Decreased activity tolerance, Decreased balance, Decreased strength, Increased edema, Impaired flexibility, Decreased scar mobility, Pain, Decreased endurance, Difficulty walking, Decreased range of motion  Visit Diagnosis: Stiffness of left knee, not elsewhere classified  Acute pain of left knee  Other abnormalities of gait and mobility  Muscle weakness (generalized)  Localized edema     Problem List There are no problems to display for this patient.  [name], PT 05/20/21 10:56 AM  [facility] [address] [place], [place], [address] Phone: [phone]   Fax:  [phone]  Name: NIXEN AVALO MRN: [id number] Date of Birth: 16-Dec-1975

## 2021-05-25 ENCOUNTER — Other Ambulatory Visit: Payer: Self-pay

## 2021-05-25 ENCOUNTER — Ambulatory Visit: Payer: BC Managed Care – PPO

## 2021-05-25 DIAGNOSIS — M25662 Stiffness of left knee, not elsewhere classified: Secondary | ICD-10-CM | POA: Diagnosis not present

## 2021-05-25 DIAGNOSIS — R2689 Other abnormalities of gait and mobility: Secondary | ICD-10-CM

## 2021-05-25 DIAGNOSIS — M25562 Pain in left knee: Secondary | ICD-10-CM

## 2021-05-25 DIAGNOSIS — M6281 Muscle weakness (generalized): Secondary | ICD-10-CM

## 2021-05-25 NOTE — Therapy (Signed)
[facility] [address] [place], [place], [address] Phone: [phone]   Fax:  [phone]  Physical Therapy Treatment  Patient Details  Name: Evan Phillips MRN: [id number] Date of Birth: 28-Mar-1976 Referring Provider (PT): Harden Mo, MD   Encounter Date: 05/25/2021   PT End of Session - 05/25/21 1053     Visit Number 14    Date for PT Re-Evaluation 06/01/21    Authorization Type BCBS- no visit limit    PT Start Time 1016    PT Stop Time 1050    PT Time Calculation (min) 34 min    Activity Tolerance Patient tolerated treatment well    Behavior During Therapy [facility] for tasks assessed/performed             Past Medical History:  Diagnosis Date   Allergy    Anxiety    GERD (gastroesophageal reflux disease)    Hyperlipidemia    Hypertension     Past Surgical History:  Procedure Laterality Date   COLONOSCOPY  01/15/2015   Henrene Pastor hyperplastic polyp   KNEE SURGERY     WISDOM TOOTH EXTRACTION      There were no vitals filed for this visit.   Subjective Assessment - 05/25/21 1019     Subjective Things are going well.  No pain after last session.    Patient Stated Goals return to prior level of function, improve strength of Lt LE, walk without device    Currently in Pain? No/denies                               [facility] Adult PT Treatment/Exercise - 05/25/21 0001       Knee/Hip Exercises: Stretches   Active Hamstring Stretch Left;3 reps;20 seconds    Active Hamstring Stretch Limitations standing on steps      Knee/Hip Exercises: Aerobic   Recumbent Bike L4 x 8 minutes; PT present to assess response and discuss progress      Knee/Hip Exercises: Standing   Forward Lunges Left;2 sets;10 reps    Forward Lunges Limitations with countertop support    Forward Step Up 2 sets;10 reps;Hand Hold: 1;Limitations    Forward Step Up Limitations using Bosu Ball    Step Down --    Wall  Squat --    Wall Squat Limitations --    SLS with green band diagonals 2x10 each    Rebounder weight shifting 3 ways x 1 min,  SLS on Lt 5x10 seconds    Walking with Sports Cord 35# forward x 10; x5 reverse 35# x 10, sidestepping    Other Standing Knee Exercises balance on Bosu ball in mini squat x 1 min      Knee/Hip Exercises: Seated   Sit to Sand 2 sets;10 reps;without UE support   holding 10# kettle bell                      PT Short Term Goals - 04/22/21 1148       PT SHORT TERM GOAL #4   Title demonstrate 100 degrees of Lt knee flexion active and passively to improve sit to stand and negotiating steps    Status Achieved               PT Long Term Goals - 05/25/21 1020       PT LONG TERM GOAL #1   Title be independent  in advanced HEP    Time 8    Period Weeks    Status On-going      PT LONG TERM GOAL #3   Title wean from crutch and immobilizer and demonstrate quad control with level surface ambulation and ascending steps on Lt    Baseline wearing brace for all standing/walking      PT LONG TERM GOAL #4   Title demonstrate Lt knee A/ROM flexion to > or = to 125 degrees to improve squatting    Status Achieved      PT LONG TERM GOAL #6   Title improve Lt quad strength to descend steps with good eccentric control    Baseline improving control.  Reduced eccentric control with single limb activity                   Plan - 05/25/21 1030     Clinical Impression Statement Pt denies any functional limitations at this time.  He continues to wear his brace for all standing and walking and has not attempted any running or sport activity per protocol precautions. PT advised pt to begin to wean from brace for home distances.  Pt is doing well with advancement of protocol including deeper squats and lunges and unstable surface of Bosu ball with step-ups.   Eccentric strength is limited with slider activity, pt fatigues quickly and requires cueing for  glute medius activation intermittently.  Need for cueing is less frequent.  Pt continues to report increased endurance and reported increased stability with descending steps at home.  He would benefit from continued skilled intervention along current POC for safe post-op progression of activity.    PT Frequency 2x / week    PT Duration 8 weeks    PT Treatment/Interventions ADLs/Self Care Home Management;Cryotherapy;Electrical Stimulation;Moist Heat;Iontophoresis 4mg /ml Dexamethasone;Gait training;Stair training;Functional mobility training;Therapeutic activities;Therapeutic exercise;Balance training;Neuromuscular re-education;Manual techniques;Patient/family education;Passive range of motion;Taping;Vasopneumatic Device    PT Next Visit Plan continue protocol- phase 3; edmema management as needed.  Can advanced to agility on 05/26/21    PT Home Exercise Plan Access Code: [email or web address]    Consulted and Agree with Plan of Care Patient             Patient will benefit from skilled therapeutic intervention in order to improve the following deficits and impairments:  Abnormal gait, Decreased activity tolerance, Decreased balance, Decreased strength, Increased edema, Impaired flexibility, Decreased scar mobility, Pain, Decreased endurance, Difficulty walking, Decreased range of motion  Visit Diagnosis: Stiffness of left knee, not elsewhere classified  Acute pain of left knee  Other abnormalities of gait and mobility  Muscle weakness (generalized)     Problem List There are no problems to display for this patient.  Evan Phillips, PT 05/25/21 10:55 AM   [facility] [address], [place], [place], [address] Phone: [phone]   Fax:  [phone]  Name: Evan Phillips MRN: [id number] Date of Birth: August 07, 1976

## 2021-05-27 ENCOUNTER — Other Ambulatory Visit: Payer: Self-pay

## 2021-05-27 ENCOUNTER — Ambulatory Visit: Payer: BC Managed Care – PPO

## 2021-05-27 DIAGNOSIS — M25562 Pain in left knee: Secondary | ICD-10-CM

## 2021-05-27 DIAGNOSIS — M25662 Stiffness of left knee, not elsewhere classified: Secondary | ICD-10-CM

## 2021-05-27 DIAGNOSIS — R600 Localized edema: Secondary | ICD-10-CM

## 2021-05-27 DIAGNOSIS — R2689 Other abnormalities of gait and mobility: Secondary | ICD-10-CM

## 2021-05-27 DIAGNOSIS — M6281 Muscle weakness (generalized): Secondary | ICD-10-CM

## 2021-05-27 NOTE — Therapy (Signed)
[facility] [address], [place], [place], [address] Phone: [phone]   Fax:  [phone]  Physical Therapy Treatment  Patient Details  Name: Evan Phillips MRN: [id number] Date of Birth: 08/07/1976 Referring Provider (PT): Harden Mo, MD   Encounter Date: 05/27/2021   PT End of Session - 05/27/21 1052     Visit Number 15    Date for PT Re-Evaluation 06/01/21    Authorization Type BCBS- no visit limit    PT Start Time 1015    PT Stop Time 6962    PT Time Calculation (min) 34 min    Equipment Utilized During Treatment Other (comment)    Activity Tolerance Patient tolerated treatment well             Past Medical History:  Diagnosis Date   Allergy    Anxiety    GERD (gastroesophageal reflux disease)    Hyperlipidemia    Hypertension     Past Surgical History:  Procedure Laterality Date   COLONOSCOPY  01/15/2015   Henrene Pastor hyperplastic polyp   KNEE SURGERY     WISDOM TOOTH EXTRACTION      There were no vitals filed for this visit.   Subjective Assessment - 05/27/21 1017     Subjective I'm doing good.    Pertinent History ACL repair 03/31/21, HTN.   FOLLOW PROTOCOL- in staff office drawer    Patient Stated Goals return to prior level of function, improve strength of Lt LE, walk without device    Currently in Pain? No/denies                               [facility] Adult PT Treatment/Exercise - 05/27/21 0001       Knee/Hip Exercises: Aerobic   Recumbent Bike L4 x 8 minutes; PT present to assess response and discuss progress      Knee/Hip Exercises: Standing   Forward Lunges Left;2 sets;10 reps    Forward Lunges Limitations added 5# bil- no UE support    Forward Step Up 2 sets;10 reps;Hand Hold: 1;Limitations    Forward Step Up Limitations using Bosu Ball    SLS --    Rebounder weight shifting 3 ways x 1 min,  SLS on Lt 5x10 seconds    Walking with Sports Cord 35# forward  x 10; x5 reverse 35# x 10, sidestepping-performed in mini squat    Other Standing Knee Exercises balance on Bosu ball in mini squat x 1 min    Other Standing Knee Exercises agility ladder: sidestepping bilaterally and forward stepping fast into each square x 4 laps each      Knee/Hip Exercises: Seated   Sit to Sand 2 sets;10 reps;without UE support   holding 15# kettle bell                      PT Short Term Goals - 04/22/21 1148       PT SHORT TERM GOAL #4   Title demonstrate 100 degrees of Lt knee flexion active and passively to improve sit to stand and negotiating steps    Status Achieved               PT Long Term Goals - 05/25/21 1020       PT LONG TERM GOAL #1   Title be independent in advanced HEP    Time 8    Period Weeks  Status On-going      PT LONG TERM GOAL #3   Title wean from crutch and immobilizer and demonstrate quad control with level surface ambulation and ascending steps on Lt    Baseline wearing brace for all standing/walking      PT LONG TERM GOAL #4   Title demonstrate Lt knee A/ROM flexion to > or = to 125 degrees to improve squatting    Status Achieved      PT LONG TERM GOAL #6   Title improve Lt quad strength to descend steps with good eccentric control    Baseline improving control.  Reduced eccentric control with single limb activity                   Plan - 05/27/21 1019     Clinical Impression Statement Pt denies any functional limitations at this time.  He continues to wear his brace for all standing and walking and has not attempted any running or sport activity per protocol precautions. Pt has been working to wean from the brace at home and reports that it feels good.  Pt is doing well with advancement of protocol including deeper squats and lunges, addition of weight and squats with resisted walking, and unstable surface of Bosu ball with step-ups.   Eccentric strength is limited with slider activity, pt fatigues  at the end of reps and requires cueing for glute medius activation intermittently.  Need for cueing is less frequent overall.  Pt continues to report increased endurance and reported increased stability with descending steps at home.  He would benefit from continued skilled intervention along current POC for safe post-op progression of activity.    PT Frequency 2x / week    PT Duration 8 weeks    PT Treatment/Interventions ADLs/Self Care Home Management;Cryotherapy;Electrical Stimulation;Moist Heat;Iontophoresis 4mg /ml Dexamethasone;Gait training;Stair training;Functional mobility training;Therapeutic activities;Therapeutic exercise;Balance training;Neuromuscular re-education;Manual techniques;Patient/family education;Passive range of motion;Taping;Vasopneumatic Device    PT Next Visit Plan continue protocol- phase 3; edmema management as needed.  Can advanced to agility on 05/26/21    PT Home Exercise Plan Access Code: [email or web address]    Consulted and Agree with Plan of Care Patient             Patient will benefit from skilled therapeutic intervention in order to improve the following deficits and impairments:  Abnormal gait, Decreased activity tolerance, Decreased balance, Decreased strength, Increased edema, Impaired flexibility, Decreased scar mobility, Pain, Decreased endurance, Difficulty walking, Decreased range of motion  Visit Diagnosis: Stiffness of left knee, not elsewhere classified  Acute pain of left knee  Other abnormalities of gait and mobility  Muscle weakness (generalized)  Localized edema     Problem List There are no problems to display for this patient.    [name], PT 05/27/21 10:54 AM   [facility] [address] [place], [place], [address] Phone: [phone]   Fax:  [phone]  Name: Evan Phillips MRN: [id number] Date of Birth: Jan 20, 1976

## 2021-06-01 ENCOUNTER — Ambulatory Visit: Payer: BC Managed Care – PPO

## 2021-06-01 ENCOUNTER — Other Ambulatory Visit: Payer: Self-pay

## 2021-06-01 DIAGNOSIS — R2689 Other abnormalities of gait and mobility: Secondary | ICD-10-CM

## 2021-06-01 DIAGNOSIS — M6281 Muscle weakness (generalized): Secondary | ICD-10-CM

## 2021-06-01 DIAGNOSIS — M25662 Stiffness of left knee, not elsewhere classified: Secondary | ICD-10-CM

## 2021-06-01 DIAGNOSIS — M25562 Pain in left knee: Secondary | ICD-10-CM

## 2021-06-01 NOTE — Therapy (Signed)
[facility] [address], [place], [place], [address] Phone: [phone]   Fax:  [phone]  Physical Therapy Treatment  Patient Details  Name: Evan Phillips MRN: [id number] Date of Birth: May 15, 1976 Referring Provider (PT): Harden Mo, MD   Encounter Date: 06/01/2021   PT End of Session - 06/01/21 1050     Visit Number 16    Date for PT Re-Evaluation 07/13/21    Authorization Type BCBS- no visit limit    PT Start Time 1017    PT Stop Time 1050    PT Time Calculation (min) 33 min    Activity Tolerance Patient tolerated treatment well    Behavior During Therapy [facility] for tasks assessed/performed             Past Medical History:  Diagnosis Date   Allergy    Anxiety    GERD (gastroesophageal reflux disease)    Hyperlipidemia    Hypertension     Past Surgical History:  Procedure Laterality Date   COLONOSCOPY  01/15/2015   Henrene Pastor hyperplastic polyp   KNEE SURGERY     WISDOM TOOTH EXTRACTION      There were no vitals filed for this visit.   Subjective Assessment - 06/01/21 1020     Subjective I'm doing well.  Weaning from brace.  Not wearing at home, just when out of the house,.  Knee feels stable with this.    Currently in Pain? No/denies                [facility] PT Assessment - 06/01/21 0001       Assessment   Medical Diagnosis s/p Lt ACL reconstruction    Referring Provider (PT) Harden Mo, MD    Onset Date/Surgical Date 03/31/21      Cognition   Overall Cognitive Status Within Functional Limits for tasks assessed      Observation/Other Assessments   Focus on Therapeutic Outcomes (FOTO)  73 (goal is 74)      PROM   Overall PROM Comments 130      Strength   Overall Strength Comments 5/5 Lt knee strength      Transfers   Comments single leg sit to stand, stand to sit- momentum and reduced eccentric control                           [facility] Adult PT  Treatment/Exercise - 06/01/21 0001       Knee/Hip Exercises: Stretches   Active Hamstring Stretch Left;3 reps;20 seconds    Active Hamstring Stretch Limitations standing on steps      Knee/Hip Exercises: Aerobic   Recumbent Bike L4 x 8 minutes; PT present to assess response and discuss progress      Knee/Hip Exercises: Standing   Forward Lunges Left;2 sets;10 reps    Forward Lunges Limitations added 5# bil- no UE support    Forward Step Up 2 sets;10 reps;Hand Hold: 1;Limitations    Forward Step Up Limitations using Bosu Ball    Rebounder weight shifting 3 ways x 1 min,  SLS on Lt 5x10 seconds    Walking with Sports Cord 45# forward x 10; x5 reverse 35# x 10, sidestepping-performed in mini squat      Knee/Hip Exercises: Seated   Sit to Sand 2 sets;10 reps;without UE support   holding 15# kettle bell  PT Education - 06/01/21 1047     Education Details Access Code: [email or web address]    Person(s) Educated Patient    Methods Explanation;Handout;Demonstration    Comprehension Verbalized understanding;Returned demonstration              PT Short Term Goals - 04/22/21 1148       PT SHORT TERM GOAL #4   Title demonstrate 100 degrees of Lt knee flexion active and passively to improve sit to stand and negotiating steps    Status Achieved               PT Long Term Goals - 06/01/21 1024       PT LONG TERM GOAL #1   Title be independent in advanced HEP    Time 6    Period Weeks    Status On-going    Target Date 07/13/21      PT LONG TERM GOAL #2   Title improve FOTO to > or = to 74 to improve functional mobility    Baseline 73    Time 6    Period Weeks    Status On-going    Target Date 07/13/21      PT LONG TERM GOAL #3   Title wean from crutch and immobilizer and demonstrate quad control with level surface ambulation and ascending steps on Lt    Status Achieved      PT LONG TERM GOAL #4   Title demonstrate Lt knee A/ROM flexion to > or  = to 125 degrees to improve squatting    Status Achieved      PT LONG TERM GOAL #5   Title perform single leg squat on the Lt from standard height chair x 5 with good alignment and without momentum due to increased functional strength    Baseline 5/5 strength with MMT on the Lt, momentum and hip adduction on the Lt with this.    Time 6    Period Weeks    Status Achieved    Target Date 07/13/21      PT LONG TERM GOAL #6   Title improve Lt quad strength to descend steps and perform single leg eccentric activity with good eccentric control    Baseline mild and intermittent Lt hip adduction with reduced glute med activation    Time 6    Period Weeks    Status On-going    Target Date 07/13/21                   Plan - 06/01/21 1036     Clinical Impression Statement Pt continues to wear his brace for all standing and walking outside of the house and has not attempted any running or sport activity (Ju Jitsu) per protocol precautions. Pt has weaned from the brace at home and reports that it feels good.  Pt is doing well with advancement of protocol including deeper squats and lunges, addition of weight and squats with resisted walking, and unstable surface of Bosu ball with step-ups.   Eccentric strength is limited with slider activity, pt fatigues at the end of reps and requires cueing for glute medius activation intermittently. 5/5 Lt knee strength although evident functional weakness with single leg sit to stand on the Lt with use of momentum and hip adduction with this.   Pt continues to report increased endurance and reported increased stability with descending steps at home.  He would benefit from continued skilled intervention (reducing to 1x/wk) along current POC for safe  post-op progression of activity to allow for safe return to [facility].    Rehab Potential Excellent    PT Frequency 1x / week    PT Duration 6 weeks    PT Treatment/Interventions ADLs/Self Care Home  Management;Cryotherapy;Electrical Stimulation;Moist Heat;Iontophoresis 4mg /ml Dexamethasone;Gait training;Stair training;Functional mobility training;Therapeutic activities;Therapeutic exercise;Balance training;Neuromuscular re-education;Manual techniques;Patient/family education;Passive range of motion;Taping;Vasopneumatic Device    PT Next Visit Plan continue protocol- phase 3; edmema management as needed.  Work on alignment, eccentric strength and single limb stability    PT Home Exercise Plan Access Code: [email or web address]    Recommended Other Services recert sent 0/27/74    Consulted and Agree with Plan of Care Patient             Patient will benefit from skilled therapeutic intervention in order to improve the following deficits and impairments:  Abnormal gait, Decreased activity tolerance, Decreased balance, Decreased strength, Increased edema, Impaired flexibility, Decreased scar mobility, Pain, Decreased endurance, Difficulty walking, Decreased range of motion  Visit Diagnosis: Stiffness of left knee, not elsewhere classified - Plan: PT plan of care cert/re-cert  Acute pain of left knee - Plan: PT plan of care cert/re-cert  Other abnormalities of gait and mobility - Plan: PT plan of care cert/re-cert  Muscle weakness (generalized) - Plan: PT plan of care cert/re-cert     Problem List There are no problems to display for this patient.   [name], PT 06/01/21 11:05 AM  [facility] [address], [place], [place], [address] Phone: [phone]   Fax:  [phone]  Name: MARTHA SOLTYS MRN: [id number] Date of Birth: 1976/04/21

## 2021-06-01 NOTE — Patient Instructions (Signed)
Access Code: [email or web address] URL: [email or web address] Date: 06/01/2021 Prepared by: Claiborne Billings  Exercises Seated Hamstring Stretch - 2 x daily - 7 x weekly - 1 sets - 3 reps - 20 hold Standing Heel Raises - 2 x daily - 7 x weekly - 2 sets - 10 reps Forward Step Down - 1 x daily - 7 x weekly - 2 sets - 10 reps Single Leg Balance with Opposite Leg Star Reach - 1 x daily - 7 x weekly - 1 sets - 10 reps Single Leg Sit to Stand with Arms Extended - 1 x daily - 7 x weekly - 10 reps Wall Slide with Posterior Pelvic Tilt - 1 x daily - 7 x weekly - 2 sets - 10 reps

## 2021-06-09 ENCOUNTER — Other Ambulatory Visit: Payer: Self-pay | Admitting: Internal Medicine

## 2021-06-09 DIAGNOSIS — E7800 Pure hypercholesterolemia, unspecified: Secondary | ICD-10-CM

## 2021-06-10 ENCOUNTER — Other Ambulatory Visit: Payer: Self-pay

## 2021-06-10 ENCOUNTER — Ambulatory Visit: Payer: BC Managed Care – PPO | Attending: Orthopedic Surgery

## 2021-06-10 DIAGNOSIS — M25562 Pain in left knee: Secondary | ICD-10-CM | POA: Diagnosis not present

## 2021-06-10 DIAGNOSIS — M6281 Muscle weakness (generalized): Secondary | ICD-10-CM | POA: Insufficient documentation

## 2021-06-10 DIAGNOSIS — R2689 Other abnormalities of gait and mobility: Secondary | ICD-10-CM | POA: Diagnosis present

## 2021-06-10 DIAGNOSIS — M25662 Stiffness of left knee, not elsewhere classified: Secondary | ICD-10-CM | POA: Insufficient documentation

## 2021-06-10 NOTE — Therapy (Addendum)
[facility] @ [place], [place], [address] Phone:     Fax:     Physical Therapy Treatment  Patient Details  Name: Evan Phillips MRN: [id number] Date of Birth: 1975-11-01 Referring Provider (PT): Harden Mo, MD   Encounter Date: 06/10/2021   PT End of Session - 06/10/21 1025     Visit Number 17    Date for PT Re-Evaluation 07/13/21    Authorization Type BCBS- no visit limit    PT Start Time [phone]   late   PT Stop Time 1016    PT Time Calculation (min) 34 min    Equipment Utilized During Treatment Other (comment)    Activity Tolerance Patient tolerated treatment well    Behavior During Therapy [facility] for tasks assessed/performed             Past Medical History:  Diagnosis Date   Allergy    Anxiety    GERD (gastroesophageal reflux disease)    Hyperlipidemia    Hypertension     Past Surgical History:  Procedure Laterality Date   COLONOSCOPY  01/15/2015   Henrene Pastor hyperplastic polyp   KNEE SURGERY     WISDOM TOOTH EXTRACTION      There were no vitals filed for this visit.   Subjective Assessment - 06/10/21 0944     Subjective Doing well.  No issues with knee.    Pertinent History ACL repair 03/31/21, HTN.   FOLLOW PROTOCOL- in staff office drawer    Currently in Pain? No/denies                               [facility] Adult PT Treatment/Exercise - 06/10/21 0001       Transfers   Comments single leg sit to stand, stand to sit- momentum and reduced eccentric control      Knee/Hip Exercises: Aerobic   Recumbent Bike L4 x 8 minutes; PT present to assess response and discuss progress      Knee/Hip Exercises: Machines for Strengthening   Hip Cybex 70# hip abduction and extension x10 each Rt and Lt      Knee/Hip Exercises: Standing   Forward Lunges Left;2 sets;10 reps    Forward Lunges Limitations added 5# bil- no UE support    Forward Step Up 2 sets;10 reps;Hand Hold:  1;Limitations    Forward Step Up Limitations using Bosu Ball    Rebounder weight shifting 3 ways x 1 min,  SLS on Lt 5x10 seconds    Walking with Sports Cord 45# forward x 10; x5 reverse 35# x 10, sidestepping-performed in mini squat                       PT Short Term Goals - 04/22/21 1148       PT SHORT TERM GOAL #4   Title demonstrate 100 degrees of Lt knee flexion active and passively to improve sit to stand and negotiating steps    Status Achieved               PT Long Term Goals - 06/01/21 1024       PT LONG TERM GOAL #1   Title be independent in advanced HEP    Time 6    Period Weeks    Status On-going    Target Date 07/13/21      PT LONG TERM GOAL #2  Title improve FOTO to > or = to 74 to improve functional mobility    Baseline 73    Time 6    Period Weeks    Status On-going    Target Date 07/13/21      PT LONG TERM GOAL #3   Title wean from crutch and immobilizer and demonstrate quad control with level surface ambulation and ascending steps on Lt    Status Achieved      PT LONG TERM GOAL #4   Title demonstrate Lt knee A/ROM flexion to > or = to 125 degrees to improve squatting    Status Achieved      PT LONG TERM GOAL #5   Title perform single leg squat on the Lt from standard height chair x 5 with good alignment and without momentum due to increased functional strength    Baseline 5/5 strength with MMT on the Lt, momentum and hip adduction on the Lt with this.    Time 6    Period Weeks    Status Achieved    Target Date 07/13/21      PT LONG TERM GOAL #6   Title improve Lt quad strength to descend steps and perform single leg eccentric activity with good eccentric control    Baseline mild and intermittent Lt hip adduction with reduced glute med activation    Time 6    Period Weeks    Status On-going    Target Date 07/13/21                   Plan - 06/10/21 1002     Clinical Impression Statement Pt has weaned from his  brace almost 100% now.    Pt is doing well with advancement of protocol including deeper squats and lunges, addition of weight and squats with resisted walking, and unstable surface of Bosu ball with step-ups.   Eccentric strength is limited with stand to sit, and pt fatigues at the end of reps and requires cueing for glute medius activation intermittently. Pt did well with multihip machine with good single limb stabilization.  5/5 Lt knee strength although evident functional weakness with single leg sit to stand on the Lt with use of momentum and hip adduction with this.   Pt continues to report increased endurance and reported increased stability with descending steps at home.  He would benefit from continued skilled intervention (reducing to 1x/wk) along current POC for safe post-op progression of activity to allow for safe return to [facility].    PT Frequency 1x / week    PT Duration 6 weeks    PT Treatment/Interventions ADLs/Self Care Home Management;Cryotherapy;Electrical Stimulation;Moist Heat;Iontophoresis 4mg /ml Dexamethasone;Gait training;Stair training;Functional mobility training;Therapeutic activities;Therapeutic exercise;Balance training;Neuromuscular re-education;Manual techniques;Patient/family education;Passive range of motion;Taping;Vasopneumatic Device    PT Next Visit Plan continue protocol- phase 3; edmema management as needed.  Work on alignment, eccentric strength and single limb stability    PT Home Exercise Plan Access Code: [email or web address]    Recommended Other Services recert is signed    Consulted and Agree with Plan of Care Patient             Patient will benefit from skilled therapeutic intervention in order to improve the following deficits and impairments:  Abnormal gait, Decreased activity tolerance, Decreased balance, Decreased strength, Increased edema, Impaired flexibility, Decreased scar mobility, Pain, Decreased endurance, Difficulty walking, Decreased range of  motion  Visit Diagnosis: Acute pain of left knee  Stiffness of left knee, not elsewhere classified  Other abnormalities of gait and mobility  Muscle weakness (generalized)     Problem List There are no problems to display for this patient.  [name], PT 06/10/21 10:43 AM   [facility] @ [place], [place], [address] Phone:     Fax:     Name: LAVAUGHN HABERLE MRN: [id number] Date of Birth: June 18, 1976

## 2021-06-15 ENCOUNTER — Ambulatory Visit: Payer: BC Managed Care – PPO

## 2021-06-15 ENCOUNTER — Other Ambulatory Visit: Payer: Self-pay

## 2021-06-15 DIAGNOSIS — M25562 Pain in left knee: Secondary | ICD-10-CM

## 2021-06-15 DIAGNOSIS — M6281 Muscle weakness (generalized): Secondary | ICD-10-CM

## 2021-06-15 DIAGNOSIS — R2689 Other abnormalities of gait and mobility: Secondary | ICD-10-CM

## 2021-06-15 DIAGNOSIS — M25662 Stiffness of left knee, not elsewhere classified: Secondary | ICD-10-CM

## 2021-06-15 NOTE — Therapy (Signed)
[facility] @ [place], [place], [address] Phone: [phone]   Fax:  [phone]  Physical Therapy Treatment  Patient Details  Name: Evan Phillips MRN: [id number] Date of Birth: Jan 18, 1976 Referring Provider (PT): Harden Mo, MD   Encounter Date: 06/15/2021   PT End of Session - 06/15/21 1140     Visit Number 18    Date for PT Re-Evaluation 07/13/21    Authorization Type BCBS- no visit limit    PT Start Time 1103    PT Stop Time 1136    PT Time Calculation (min) 33 min    Activity Tolerance Patient tolerated treatment well    Behavior During Therapy [facility] for tasks assessed/performed             Past Medical History:  Diagnosis Date   Allergy    Anxiety    GERD (gastroesophageal reflux disease)    Hyperlipidemia    Hypertension     Past Surgical History:  Procedure Laterality Date   COLONOSCOPY  01/15/2015   Henrene Pastor hyperplastic polyp   KNEE SURGERY     WISDOM TOOTH EXTRACTION      There were no vitals filed for this visit.   Subjective Assessment - 06/15/21 1141     Subjective I'm only wearing my brace if I'm going somewhere crowded.                               [facility] Adult PT Treatment/Exercise - 06/15/21 0001       Transfers   Comments single leg sit to stand, stand to sit- momentum and reduced eccentric control      Knee/Hip Exercises: Stretches   Active Hamstring Stretch Left;3 reps;20 seconds    Active Hamstring Stretch Limitations standing on steps      Knee/Hip Exercises: Aerobic   Recumbent Bike L4 x 8 minutes; PT present to assess response and discuss progress      Knee/Hip Exercises: Machines for Strengthening   Total Gym Leg Press Leg press: seat 7 120# x10, 130# x10, Lt only 70# x10, 80# x10    Hip Cybex 70# hip abduction and extension x10 each Rt and Lt      Knee/Hip Exercises: Standing   Forward Lunges --    Forward Lunges Limitations  --    Forward Step Up 2 sets;10 reps;Hand Hold: 1;Limitations    Forward Step Up Limitations using Bosu Ball    SLS single leg stance on green pod- Diagonal pull down 17# down, 10# pull across 2x10 each    Other Standing Knee Exercises agility ladder: sidestepping bilaterally and forward stepping fast into each square x 4 laps each.  Lateral hopping and forward hopping 2x2 each      Knee/Hip Exercises: Seated   Sit to Sand 2 sets;10 reps;without UE support   holding 15# kettle bell                      PT Short Term Goals - 04/22/21 1148       PT SHORT TERM GOAL #4   Title demonstrate 100 degrees of Lt knee flexion active and passively to improve sit to stand and negotiating steps    Status Achieved               PT Long Term Goals - 06/01/21 1024       PT LONG  TERM GOAL #1   Title be independent in advanced HEP    Time 6    Period Weeks    Status On-going    Target Date 07/13/21      PT LONG TERM GOAL #2   Title improve FOTO to > or = to 74 to improve functional mobility    Baseline 73    Time 6    Period Weeks    Status On-going    Target Date 07/13/21      PT LONG TERM GOAL #3   Title wean from crutch and immobilizer and demonstrate quad control with level surface ambulation and ascending steps on Lt    Status Achieved      PT LONG TERM GOAL #4   Title demonstrate Lt knee A/ROM flexion to > or = to 125 degrees to improve squatting    Status Achieved      PT LONG TERM GOAL #5   Title perform single leg squat on the Lt from standard height chair x 5 with good alignment and without momentum due to increased functional strength    Baseline 5/5 strength with MMT on the Lt, momentum and hip adduction on the Lt with this.    Time 6    Period Weeks    Status Achieved    Target Date 07/13/21      PT LONG TERM GOAL #6   Title improve Lt quad strength to descend steps and perform single leg eccentric activity with good eccentric control    Baseline  mild and intermittent Lt hip adduction with reduced glute med activation    Time 6    Period Weeks    Status On-going    Target Date 07/13/21                   Plan - 06/15/21 1129     Clinical Impression Statement Pt has weaned from his brace almost 100% now.  Pt is only wearing when out in a busy area of the community.    Pt is doing well with advancement of protocol including deeper squats and lunges, addition of leg press and agility ladder, and unstable surface of Bosu ball with step-ups.    Pt did well with multihip machine with good single limb stabilization with this and single leg stance on green pod with shoulder cable diagonals.  5/5 Lt knee strength although evident functional weakness with single leg sit to stand on the Lt with use of momentum and hip adduction with this.   Pt continues to report increased endurance and reported increased stability with descending steps at home.  He would benefit from continued skilled intervention (reducing to 1x/wk) along current POC for safe post-op progression of activity to allow for safe return to [facility].             Patient will benefit from skilled therapeutic intervention in order to improve the following deficits and impairments:     Visit Diagnosis: Stiffness of left knee, not elsewhere classified  Acute pain of left knee  Muscle weakness (generalized)  Other abnormalities of gait and mobility     Problem List There are no problems to display for this patient.  [name], PT 06/15/21 11:42 AM   [facility] @ [place], [place], [address] Phone: [phone]   Fax:  [phone]  Name: Evan Phillips MRN: [id number] Date of Birth: 1976-01-01

## 2021-06-22 ENCOUNTER — Ambulatory Visit: Payer: BC Managed Care – PPO

## 2021-06-22 ENCOUNTER — Other Ambulatory Visit: Payer: Self-pay

## 2021-06-22 DIAGNOSIS — R2689 Other abnormalities of gait and mobility: Secondary | ICD-10-CM

## 2021-06-22 DIAGNOSIS — M6281 Muscle weakness (generalized): Secondary | ICD-10-CM

## 2021-06-22 DIAGNOSIS — M25562 Pain in left knee: Secondary | ICD-10-CM | POA: Diagnosis not present

## 2021-06-22 DIAGNOSIS — M25662 Stiffness of left knee, not elsewhere classified: Secondary | ICD-10-CM

## 2021-06-22 NOTE — Therapy (Signed)
[place] @ [place], [place], [address] Phone: [phone]   Fax:  [phone]  Physical Therapy Treatment  Patient Details  Name: Evan Phillips MRN: [id number] Date of Birth: September 23, 1975 Referring Provider (PT): Harden Mo, MD   Encounter Date: 06/22/2021   PT End of Session - 06/22/21 0953     Visit Number 19    Date for PT Re-Evaluation 07/13/21    Authorization Type BCBS- no visit limit    PT Start Time 0930    PT Stop Time 1006    PT Time Calculation (min) 36 min    Equipment Utilized During Treatment Other (comment)    Activity Tolerance Patient tolerated treatment well    Behavior During Therapy [facility] for tasks assessed/performed             Past Medical History:  Diagnosis Date   Allergy    Anxiety    GERD (gastroesophageal reflux disease)    Hyperlipidemia    Hypertension     Past Surgical History:  Procedure Laterality Date   COLONOSCOPY  01/15/2015   Henrene Pastor hyperplastic polyp   KNEE SURGERY     WISDOM TOOTH EXTRACTION      There were no vitals filed for this visit.   Subjective Assessment - 06/22/21 0946     Subjective I'm only wearing my brace if I'm going to the gym.    Pertinent History ACL repair 03/31/21, HTN.   FOLLOW PROTOCOL- in staff office drawer    Patient Stated Goals return to prior level of function, improve strength of Lt LE, walk without device    Pain Score 0-No pain    Pain Location Knee                               [facility] Adult PT Treatment/Exercise - 06/22/21 0001       Knee/Hip Exercises: Aerobic   Recumbent Bike L4 x 8 minutes; PT present to assess response and discuss progress      Knee/Hip Exercises: Machines for Strengthening   Total Gym Leg Press Leg press: seat 7 130# 3 x10,  Lt only 70# 3 x10    Hip Cybex 70# hip abduction and extension x10 each Rt and Lt      Knee/Hip Exercises: Standing   Forward Step Up 2  sets;10 reps;Hand Hold: 1;Limitations    Forward Step Up Limitations using Bosu Ball    SLS single leg stance on green pod- Diagonal pull down 17# down, 10# pull across 2x10 each    Other Standing Knee Exercises agility ladder: sidestepping bilaterally and forward stepping fast into each square x 4 laps each.  Lateral hopping and forward hopping 2x2 each                       PT Short Term Goals - 04/22/21 1148       PT SHORT TERM GOAL #4   Title demonstrate 100 degrees of Lt knee flexion active and passively to improve sit to stand and negotiating steps    Status Achieved               PT Long Term Goals - 06/01/21 1024       PT LONG TERM GOAL #1   Title be independent in advanced HEP    Time 6    Period Weeks    Status  On-going    Target Date 07/13/21      PT LONG TERM GOAL #2   Title improve FOTO to > or = to 74 to improve functional mobility    Baseline 73    Time 6    Period Weeks    Status On-going    Target Date 07/13/21      PT LONG TERM GOAL #3   Title wean from crutch and immobilizer and demonstrate quad control with level surface ambulation and ascending steps on Lt    Status Achieved      PT LONG TERM GOAL #4   Title demonstrate Lt knee A/ROM flexion to > or = to 125 degrees to improve squatting    Status Achieved      PT LONG TERM GOAL #5   Title perform single leg squat on the Lt from standard height chair x 5 with good alignment and without momentum due to increased functional strength    Baseline 5/5 strength with MMT on the Lt, momentum and hip adduction on the Lt with this.    Time 6    Period Weeks    Status Achieved    Target Date 07/13/21      PT LONG TERM GOAL #6   Title improve Lt quad strength to descend steps and perform single leg eccentric activity with good eccentric control    Baseline mild and intermittent Lt hip adduction with reduced glute med activation    Time 6    Period Weeks    Status On-going    Target  Date 07/13/21                   Plan - 06/22/21 1007     Clinical Impression Statement Patient is wearing his brace onlly at the gym.  He has very little pain to speak of.  He continues to demonstrate compensation due to hip weakness on activities such as leg press and SLS tasks.  Patient sees referring MD tomorrow.  He is compliant and well motivated with his HEP and is progressing appropriately in the gym.  He would benefit from continuing skilled intervention to restore function and safe return to recreational activities.    Personal Factors and Comorbidities Comorbidity 1    Comorbidities ACL repair-LT    Examination-Activity Limitations Locomotion Level;Squat;Stairs;Stand;Transfers    Examination-Participation Restrictions Community Activity;Occupation    Stability/Clinical Decision Making Stable/Uncomplicated    Rehab Potential Excellent    PT Frequency 1x / week    PT Duration 6 weeks    PT Treatment/Interventions ADLs/Self Care Home Management;Cryotherapy;Electrical Stimulation;Moist Heat;Iontophoresis 4mg /ml Dexamethasone;Gait training;Stair training;Functional mobility training;Therapeutic activities;Therapeutic exercise;Balance training;Neuromuscular re-education;Manual techniques;Patient/family education;Passive range of motion;Taping;Vasopneumatic Device    PT Next Visit Plan continue protocol- phase 3; edmema management as needed.  Work on alignment, eccentric strength and single limb stability    PT Home Exercise Plan Access Code: [email or web address]    [email or web address] and Agree with Plan of Care Patient             Patient will benefit from skilled therapeutic intervention in order to improve the following deficits and impairments:  Abnormal gait, Decreased activity tolerance, Decreased balance, Decreased strength, Increased edema, Impaired flexibility, Decreased scar mobility, Pain, Decreased endurance, Difficulty walking, Decreased range of motion  Visit Diagnosis: Stiffness of  left knee, not elsewhere classified  Muscle weakness (generalized)  Other abnormalities of gait and mobility     Problem List There are no problems to display for this patient.  Anderson Malta B. Pesach Frisch, PT10/18/2210:16 AM   [facility] @ [place], [place], [address] Phone: [phone]   Fax:  [phone]  Name: Evan Phillips MRN: [id number] Date of Birth: 1976-08-03

## 2021-06-29 ENCOUNTER — Other Ambulatory Visit: Payer: Self-pay

## 2021-06-29 ENCOUNTER — Ambulatory Visit: Payer: BC Managed Care – PPO

## 2021-06-29 DIAGNOSIS — M25562 Pain in left knee: Secondary | ICD-10-CM | POA: Diagnosis not present

## 2021-06-29 DIAGNOSIS — R2689 Other abnormalities of gait and mobility: Secondary | ICD-10-CM

## 2021-06-29 DIAGNOSIS — M25662 Stiffness of left knee, not elsewhere classified: Secondary | ICD-10-CM

## 2021-06-29 DIAGNOSIS — M6281 Muscle weakness (generalized): Secondary | ICD-10-CM

## 2021-06-29 NOTE — Therapy (Signed)
[facility] @ [place], [place], [address] Phone: [phone]   Fax:  [phone]  Physical Therapy Treatment  Patient Details  Name: Evan Phillips MRN: [id number] Date of Birth: 1976-03-16 Referring Provider (Phillips): Harden Mo, MD   Encounter Date: 06/29/2021   Phillips End of Session - 06/29/21 1003     Visit Number 20    Authorization Type BCBS- no visit limit    Phillips Start Time 0930    Phillips Stop Time 1003    Phillips Time Calculation (min) 33 min    Activity Tolerance Patient tolerated treatment well    Behavior During Therapy [facility] for tasks assessed/performed             Past Medical History:  Diagnosis Date   Allergy    Anxiety    GERD (gastroesophageal reflux disease)    Hyperlipidemia    Hypertension     Past Surgical History:  Procedure Laterality Date   COLONOSCOPY  01/15/2015   Henrene Pastor hyperplastic polyp   KNEE SURGERY     WISDOM TOOTH EXTRACTION      There were no vitals filed for this visit.   Subjective Assessment - 06/29/21 0939     Subjective I saw the MD and he said my ligament felt solid.  Told me to get back to regular activity as able.  I tried Jujitsu and I was sore.    Pertinent History ACL repair 03/31/21, HTN.   FOLLOW PROTOCOL- in staff office drawer    Patient Stated Goals return to prior level of function, improve strength of Lt LE, walk without device    Currently in Pain? No/denies                [facility] Phillips Assessment - 06/29/21 0001       Assessment   Medical Diagnosis s/p Lt ACL reconstruction    Referring Provider (Phillips) Harden Mo, MD    Onset Date/Surgical Date 03/31/21      Observation/Other Assessments   Focus on Therapeutic Outcomes (FOTO)  91      Transfers   Comments single leg sit to stand, stand to sit- momentum and reduced eccentric control                           [facility] Treatment/Exercise - 06/29/21 0001        Knee/Hip Exercises: Aerobic   Recumbent Bike L4 x 8 minutes; Phillips present to assess response and discuss progress      Knee/Hip Exercises: Machines for Strengthening   Total Gym Leg Press Leg press: seat 7 130# 3 x10,  Lt only 70# 3 x10      Knee/Hip Exercises: Standing   Forward Step Up 2 sets;10 reps;Hand Hold: 1;Limitations    Forward Step Up Limitations using Bosu Ball    Other Standing Knee Exercises agility ladder: sidestepping bilaterally and forward stepping fast into each square x 4 laps each.  Lateral hopping and forward hopping 2x2 each                       Phillips Short Term Goals - 04/22/21 1148       Phillips SHORT TERM GOAL #4   Title demonstrate 100 degrees of Lt knee flexion active and passively to improve sit to stand and negotiating steps    Status Achieved  Phillips Long Term Goals - 06/29/21 0941       Phillips LONG TERM GOAL #1   Title be independent in advanced HEP    Status Achieved      Phillips LONG TERM GOAL #2   Title improve FOTO to > or = to 74 to improve functional mobility    Status Achieved      Phillips LONG TERM GOAL #3   Title wean from crutch and immobilizer and demonstrate quad control with level surface ambulation and ascending steps on Lt    Status Achieved      Phillips LONG TERM GOAL #4   Title demonstrate Lt knee A/ROM flexion to > or = to 125 degrees to improve squatting    Status Achieved      Phillips LONG TERM GOAL #5   Title perform single leg squat on the Lt from standard height chair x 5 with good alignment and without momentum due to increased functional strength    Baseline Lt knee instability and reduced glute med activation with this.  Uncontrolled descent.      Phillips LONG TERM GOAL #6   Title improve Lt quad strength to descend steps and perform single leg eccentric activity with good eccentric control    Baseline normal gait pattern with control    Status Achieved                   Plan - 06/29/21 1008     Clinical  Impression Statement Phillips is ready for D/C to HEP.  Phillips has seen the MD and has been released.  He is allowed to return to jujitsu as able.  He tried this and was a little bit sore but not too bad.  Phillips with some Lt knee instability with higher level tasks such as single leg squats and stance on unlevel surfaces. FOTO is improved to 91.  Phillips has HEP and will D/C today.    Phillips Next Visit Plan D/C Phillips    Phillips Home Exercise Plan Access Code: [email or web address]             Patient will benefit from skilled therapeutic intervention in order to improve the following deficits and impairments:     Visit Diagnosis: Stiffness of left knee, not elsewhere classified  Muscle weakness (generalized)  Other abnormalities of gait and mobility     Problem List There are no problems to display for this patient. PHYSICAL THERAPY DISCHARGE SUMMARY  Visits from Start of Care: 20  Current functional level related to goals / functional outcomes: See above for current status.     Remaining deficits: Lt knee instability with high level balance tasks.   Education / Equipment: HEP   Patient agrees to discharge. Patient goals were met. Patient is being discharged due to meeting the stated rehab goals.  Evan [place], Phillips 06/29/21 10:10 AM   [place], [place], [address] Phone: [phone]   Fax:  [phone]  Name: Evan Phillips MRN: [id number] Date of Birth: 06/19/1976

## 2021-06-30 ENCOUNTER — Ambulatory Visit
Admission: RE | Admit: 2021-06-30 | Discharge: 2021-06-30 | Disposition: A | Discharge status: Home / Self Care | Payer: BC Managed Care – PPO | Source: Ambulatory Visit | Attending: Internal Medicine | Admitting: Internal Medicine

## 2021-06-30 DIAGNOSIS — E7800 Pure hypercholesterolemia, unspecified: Secondary | ICD-10-CM

## 2022-07-11 IMAGING — CT CT CARDIAC CORONARY ARTERY CALCIUM SCORE
3 series · 14 of 20 positions shown, 16 images · non-contrast
Comparison: None.

CLINICAL DATA: 45-year-old Caucasian male with history of
hyperlipidemia and family history of heart disease.

EXAM:
CT CARDIAC CORONARY ARTERY CALCIUM SCORE
TECHNIQUE: Non-contrast imaging through the heart was performed using
prospective ECG gating. Image post processing was performed on an
independent workstation, allowing for quantitative analysis of the
heart and coronary arteries. Note that this exam targets the heart
and the chest was not imaged in its entirety.

[Series 2: calcium scoring 2.00 qr36 bestdiast 70% hrt calciu · axial · 0.42mm/px · z∈[+1572,+1668]mm · 4 of 80 slices shown]
[im 16/80  vessel]
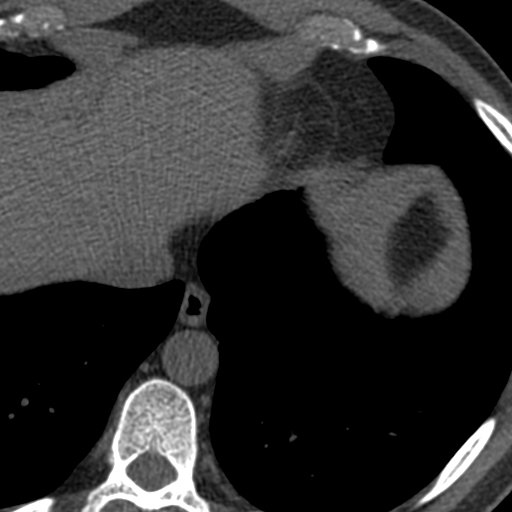
[im 32/80  vessel]
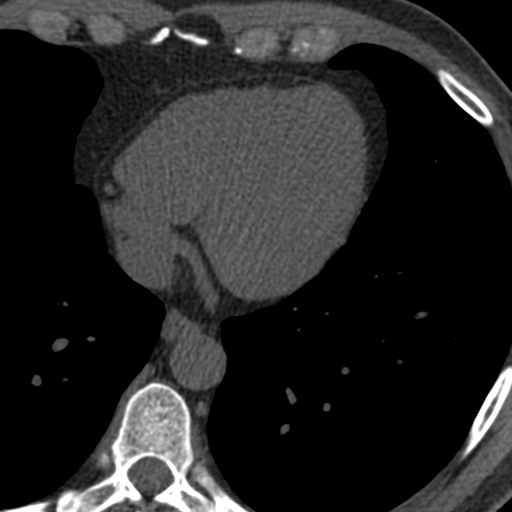
[im 48/80  vessel]
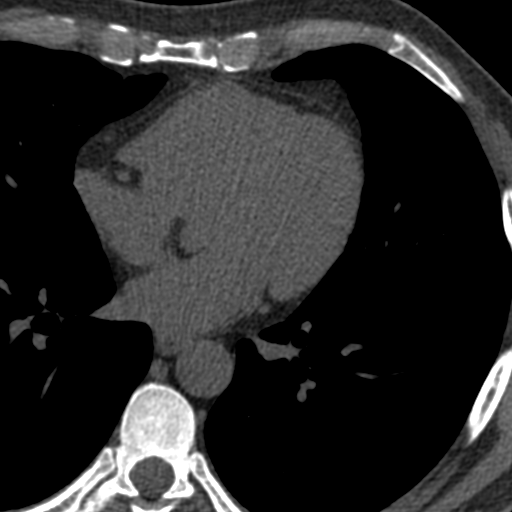
[im 64/80  vessel]
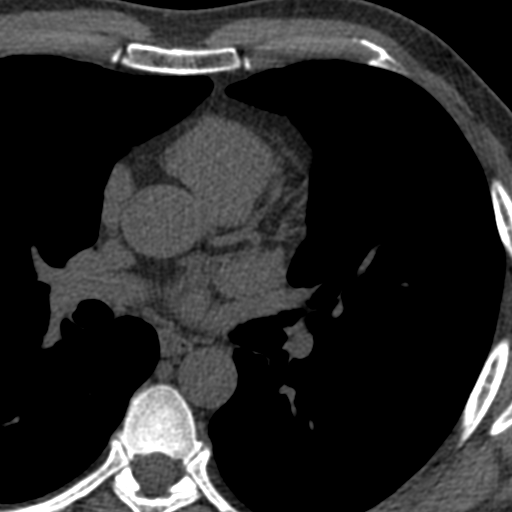

[Series 3: calcium scoring 2.00 br40 bestdiast 70% axial · axial · 0.61mm/px · z∈[+1568,+1672]mm · 5 of 80 slices shown, 7 images]
[im 14/80  vessel]
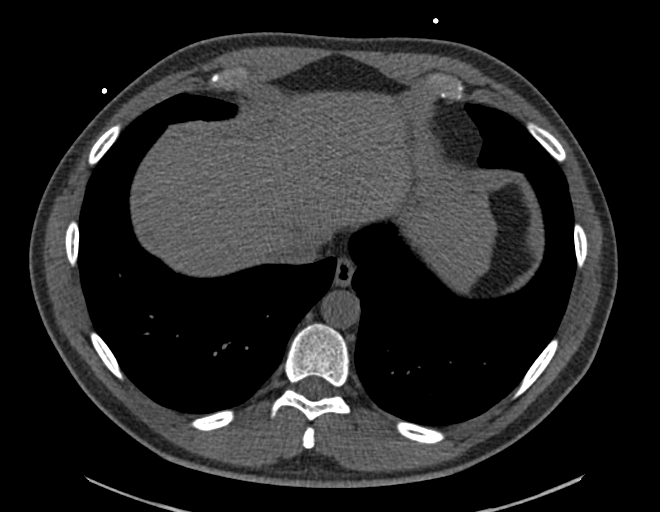
[im 14/80  lung]
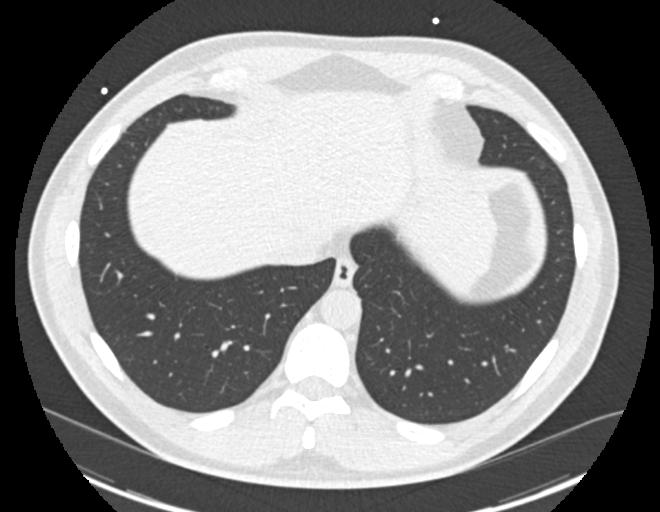
[im 27/80  vessel]
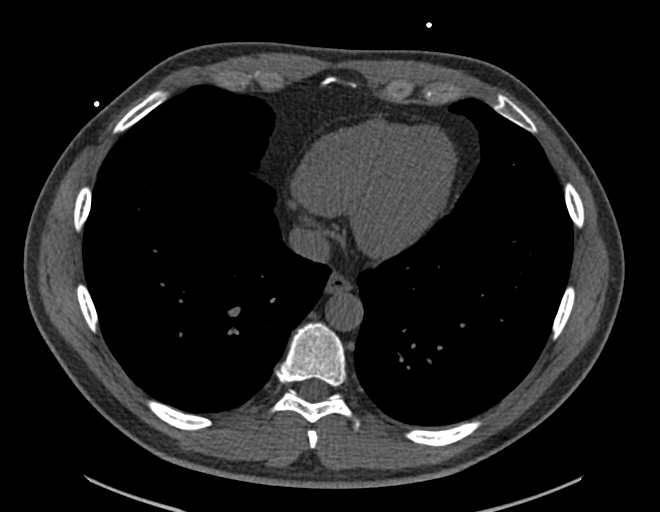
[im 40/80  vessel]
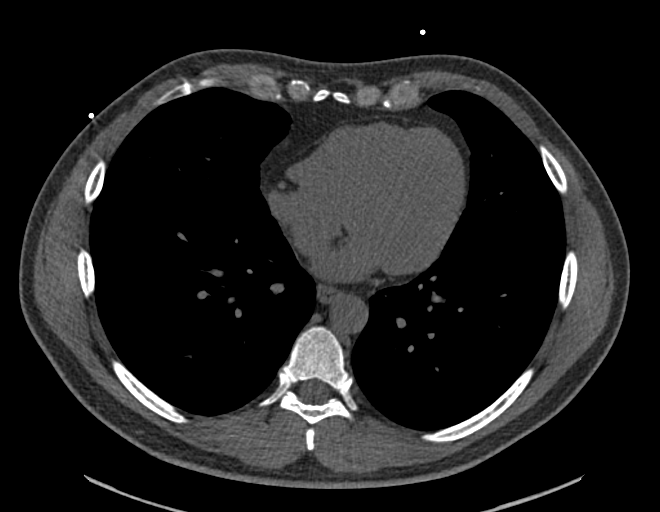
[im 53/80  vessel]
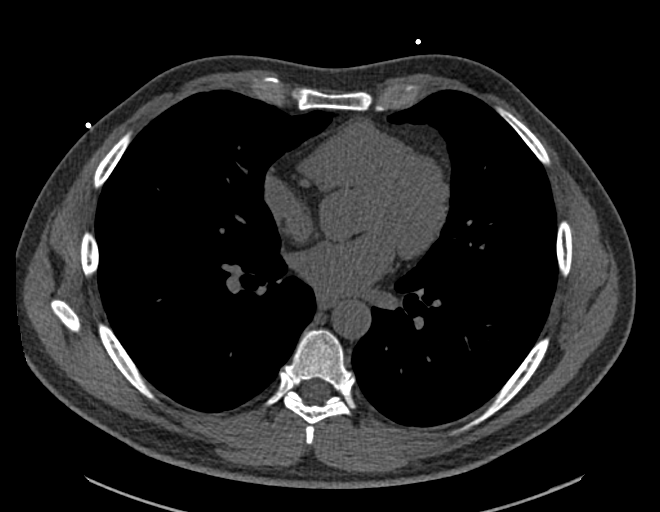
[im 66/80  vessel]
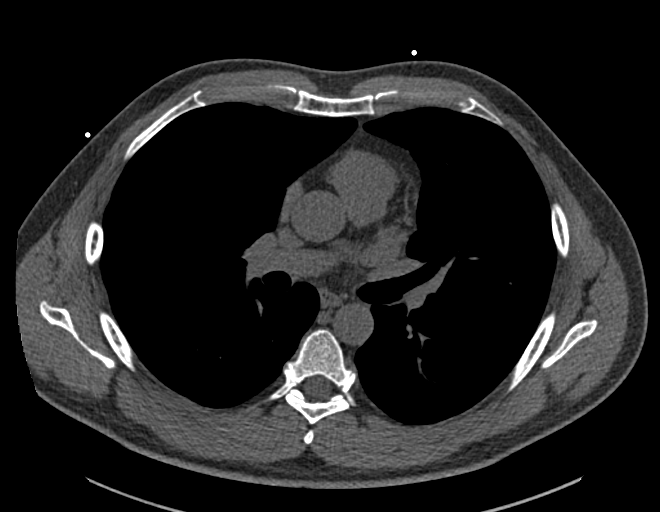
[im 66/80  lung]
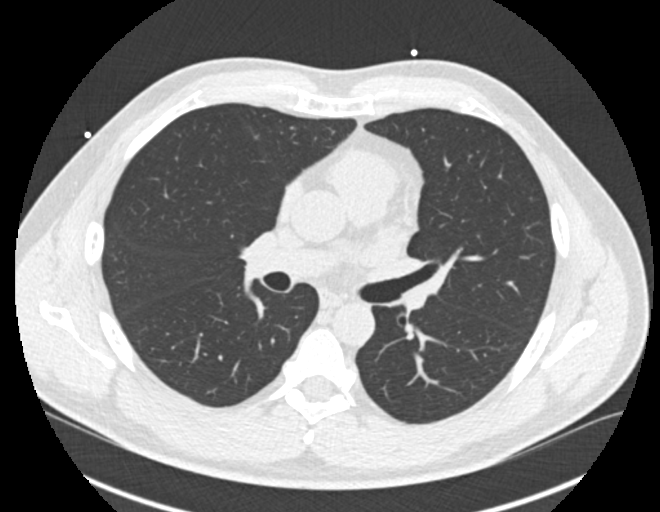

[Series 9: calcium scoring 2.00 br60 bestdiast 70% lungs · axial · 0.61mm/px · z∈[+1568,+1672]mm · 5 of 80 slices shown]
[im 14/80  vessel]
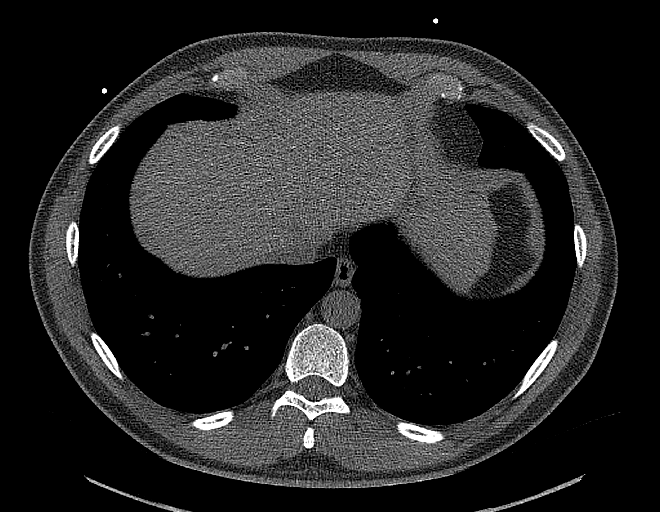
[im 27/80  vessel]
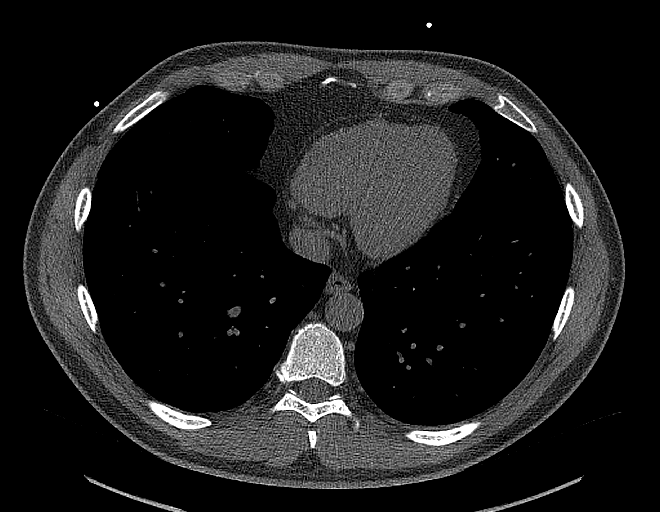
[im 40/80  vessel]
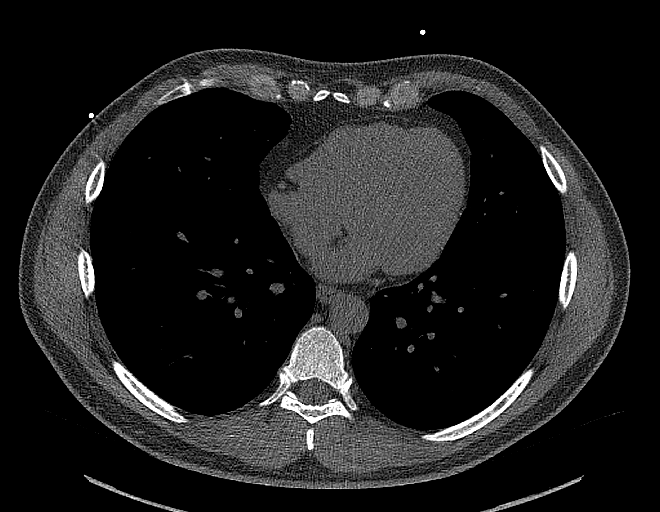
[im 53/80  vessel]
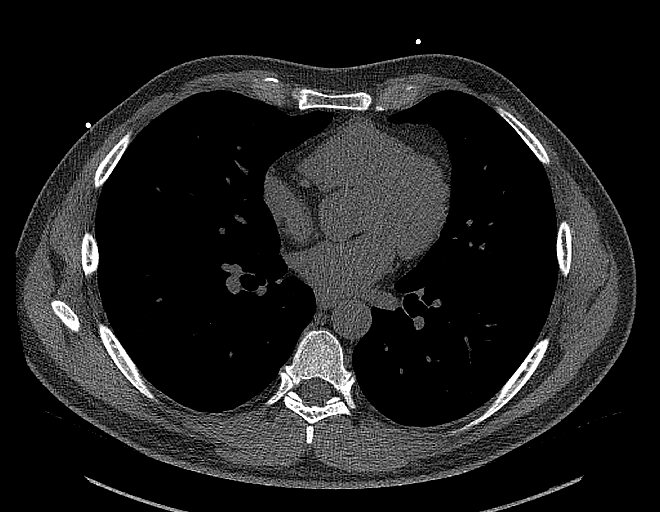
[im 66/80  vessel]
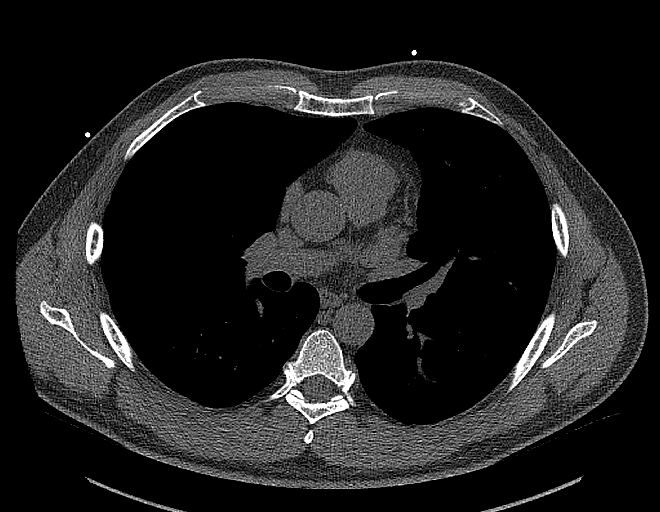

[14 of 20 positions shown; findings below may reference images not displayed]

FINDINGS: CORONARY CALCIUM SCORES:

Left Main: 0

LAD: 0

LCx: 0

RCA: 0

Total Agatston Score: 0

[HOSPITAL] percentile: 0

AORTA MEASUREMENTS:

Ascending Aorta: 31 mm

Descending Aorta: 24 mm

OTHER FINDINGS:


## 2022-10-25 ENCOUNTER — Encounter: Payer: Self-pay | Admitting: Physician Assistant

## 2022-10-26 ENCOUNTER — Ambulatory Visit: Payer: BC Managed Care – PPO | Admitting: Family Medicine

## 2022-10-26 ENCOUNTER — Encounter: Payer: Self-pay | Admitting: Family Medicine

## 2022-10-26 ENCOUNTER — Ambulatory Visit (INDEPENDENT_AMBULATORY_CARE_PROVIDER_SITE_OTHER): Payer: BC Managed Care – PPO

## 2022-10-26 VITALS — BP 140/90 | HR 83 | Ht 70.0 in | Wt 208.8 lb

## 2022-10-26 DIAGNOSIS — M545 Low back pain, unspecified: Secondary | ICD-10-CM

## 2022-10-26 DIAGNOSIS — M5431 Sciatica, right side: Secondary | ICD-10-CM

## 2022-10-26 MED ORDER — PREDNISONE 50 MG PO TABS: 50.0000 mg | ORAL_TABLET | Freq: Every day | ORAL | 0 refills | Status: DC

## 2022-10-26 MED ORDER — GABAPENTIN 300 MG PO CAPS: 300.0000 mg | ORAL_CAPSULE | Freq: Three times a day (TID) | ORAL | 3 refills | Status: AC | PRN

## 2022-10-26 NOTE — Progress Notes (Signed)
   I, Peterson Lombard, LAT, ATC acting as a scribe for Lynne Leader, MD.  Subjective:    CC: Low back pain  HPI: Patient is a 47 year old male presenting with low back pain off and on for a long time, worsening over the past  4 weeks.  Patient locates pain to right side of lower back pain radiating into the gluteal region and R LE. Denies groin pain. No recent imaging. Denies past injury to the past. Has been to [facility] with short-term relief. Stretching provides short-term relief. No relief with IBU. Worsening pain with attempting to sit down and twisting.  Pain radiates to the posterior thigh but not past the knee.  He has been going to the chiropractor for this for 4 weeks.  His chiropractor gave him a home exercise program which she is completing diligently for the last 4 weeks with minimal benefit.  Radiating pain: yes LE numbness/tingling: no LE weakness: no Aggravates: movement Treatments tried: Chiro, IBU, stretching  Pertinent review of Systems: No fevers or chills  Relevant historical information: Hyperlipidemia   Objective:    Vitals:   10/26/22 1342  BP: (!) 140/90  Pulse: 83  SpO2: 95%   General: Well Developed, well nourished, and in no acute distress.   MSK: L-spine: Normal appearing Nontender palpation spinal midline Normal lumbar motion. Lower extremity strength is intact. Positive right-sided slump test. Reflexes and sensation are intact distally.  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  No acute fractures.  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   Await formal radiology review   Impression and Recommendations:    Assessment and Plan: 47 y.o. male with right low back pain radiating to the right leg along the posterior thigh.  Leg pain thought to be S1 radiculopathy.  Plan for course of prednisone and gabapentin.  Continue home exercise program and chiropractor care.  If not  improving over the next 2 weeks or so he will let me know and we can proceed to MRI lumbar spine to assess for potential lumbar epidural steroid injection for lumbosacral radiculopathy..  Warning signs and symptoms reviewed.  PDMP not reviewed this encounter. Orders Placed This Encounter  Procedures   DG Lumbar Spine 2-3 Views    Standing Status:   Future    Number of Occurrences:   1    Standing Expiration Date:   04/26/2023    Order Specific Question:   Reason for Exam (SYMPTOM  OR DIAGNOSIS REQUIRED)    Answer:   low back pain, right-sided    Order Specific Question:   Preferred imaging location?    Answer:   [facility]   Meds ordered this encounter  Medications   predniSONE (DELTASONE) 50 MG tablet    Sig: Take 1 tablet (50 mg total) by mouth daily.    Dispense:  5 tablet    Refill:  0   gabapentin (NEURONTIN) 300 MG capsule    Sig: Take 1 capsule (300 mg total) by mouth 3 (three) times daily as needed.    Dispense:  90 capsule    Refill:  3    Discussed warning signs or symptoms. Please see discharge instructions. Patient expresses understanding.   The above documentation has been reviewed and is accurate and complete Lynne Leader, M.D.

## 2022-10-26 NOTE — Patient Instructions (Signed)
Thank you for coming in today.   Please get an Xray today before you leave   Continue chiropractor care and home exercise plan.   Take the prednisone for 5 days.   Ok to use gabapentin as needed for nerve pain mostly at bedtime.   If not improving let me know.   Next step would likely be MRI for injection planning.

## 2022-10-28 NOTE — Progress Notes (Signed)
Lumbar spine x-ray shows arthritis changes

## 2022-11-06 NOTE — Progress Notes (Unsigned)
11/07/2022 [facility] [id number] [birth year]  Referring provider: Deland Pretty, MD Primary GI doctor: Dr. Henrene Pastor  ASSESSMENT AND PLAN:   Iron deficiency anemia, unspecified iron deficiency anemia type New onset 06/2022, corresponding to rectal bleeding With history and physical exam most likely this represents anemia from hemorrhoidal bleeding however due to family history of colon cancer and GERD history will schedule for EGD and colonoscopy to evaluate further. Given hydrocortisone suppositories and cream, information regarding hemorrhoids. Discussed after procedure can potentially schedule for hemorrhoidal banding in the office if amendable.  Gastroesophageal reflux disease without esophagitis Lifestyle changes discussed, avoid NSAIDS, ETOH Continue on the same medication, reports symptoms are well controlled.   Hemorrhoids, unspecified hemorrhoid type -Sitz baths, increase fiber, increase water -Hydrocortisone supp given and external cream sent in.  -We discussed hemorrhoid banding here in the office for internal hemorrhoids if not improving with conservative treatment after colonoscopy  Family history of colon cancer in mother 08/04/2020 colonoscopy with Dr. Henrene Pastor for family history of colorectal cancer in mother age 2s previous examination 2016 negative quality of the prep excellent, diverticulosis sigmoid colon, internal hemorrhoids, recall 5 years recall 07/2025    Patient Care Team: Deland Pretty, MD as PCP - General (Internal Medicine)  HISTORY OF PRESENT ILLNESS: 47 y.o. male with a past medical history of HTN, HLD, GERD and others listed below presents for evaluation of IDA.   08/04/2020 colonoscopy with Dr. Henrene Pastor for family history of colorectal cancer in mother age 47s previous examination 2016 negative quality of the prep excellent, diverticulosis sigmoid colon, internal hemorrhoids, recall 5 years  He was told he was anemic about 3-4 months ago, started  on oral iron once daily and tolerating well. He states his anemia has improved.  He was feeling weak/fatigued about a year ago but has felt well on the oral iron. No chest pain, no SOB, palpitations, dizziness.  Labs reviewed from primary care: 06/08/2022 hemoglobin 11.7, MCV 75.6, platelets 202, WBC 5.3, iron 28, percent saturation 7, ferritin 9.8 placed on oral iron, and CR 1.47, BUN 12 09/06/2018 4 repeat labs hemoglobin 14.6, MCV 75.8, platelets 218, WBC 7.1  Leading up to his anemia, the patient states  he was having BRB moderate volume for weeks thought to be due from his hemorrhoids. Last saw blood day before yesterday.  Denies rectal pain, burning, itching.  Never tried anything to treat them.  BM every day, no loose stools, no changes in bowel habits.  Has had GERD for years, he is on famotidine and this helps.  Had prednisone 5 days for back injury.  Denies dysphagia, AB pain.  Denies melena, hematochezia.  Has not done stool cards.  Rare NSAIDS.  4-5 beers a week, social.    He  reports that he has never smoked. He has never used smokeless tobacco. He reports current alcohol use of about 4.0 standard drinks of alcohol per week. He reports that he does not use drugs.   Current Medications:   Current Outpatient Medications (Endocrine & Metabolic):    testosterone cypionate (DEPOTESTOTERONE CYPIONATE) 100 MG/ML injection, Inject 1.5 mLs into the muscle every 14 (fourteen) days. For IM use only  Current Outpatient Medications (Cardiovascular):    atorvastatin (LIPITOR) 20 MG tablet,    olmesartan (BENICAR) 40 MG tablet, Take 40 mg by mouth daily.  Current Outpatient Medications (Respiratory):    loratadine (CLARITIN) 10 MG tablet, Take 10 mg by mouth daily.  Current Outpatient Medications (Analgesics):    traMADol (  ULTRAM) 50 MG tablet, 1-2 tablets as needed (may use with tylenol maximum of 3000 mg per day) Orally Once a day for 30 days  Current Outpatient Medications  (Hematological):    Ferrous Sulfate 47.5 MG TBCR, Take 1 tablet by mouth every other day.  Current Outpatient Medications (Other):    famotidine (PEPCID) 20 MG tablet, Take 20 mg by mouth as needed for indigestion or heartburn.   gabapentin (NEURONTIN) 300 MG capsule, Take 1 capsule (300 mg total) by mouth 3 (three) times daily as needed.   hydrocortisone (ANUSOL-HC) 2.5 % rectal cream, Place 1 Application rectally 2 (two) times daily.   hydrocortisone (ANUSOL-HC) 25 MG suppository, Place 1 suppository (25 mg total) rectally 2 (two) times daily.   Multiple Vitamin (MULTIVITAMIN) tablet, Take 1 tablet by mouth daily.   omeprazole (PRILOSEC) 10 MG capsule, Take 10 mg by mouth daily.   PEG-KCl-NaCl-NaSulf-Na Asc-C (PLENVU) 140 g SOLR, As directed by GI Instructions   sertraline (ZOLOFT) 100 MG tablet, Take 100 mg by mouth daily.  Medical History:  Past Medical History:  Diagnosis Date   Allergy    Anxiety    GERD (gastroesophageal reflux disease)    Hyperlipidemia    Hypertension    Allergies: No Known Allergies   Surgical History:  He  has a past surgical history that includes Knee surgery; Wisdom tooth extraction; and Colonoscopy (01/15/2015). Family History:  His family history includes Colon cancer in his mother; Heart disease in his maternal grandfather.  REVIEW OF SYSTEMS  : All other systems reviewed and negative except where noted in the History of Present Illness.  PHYSICAL EXAM: BP 122/82   Pulse 74   Ht '5\' 10"'$  (1.778 m)   Wt 203 lb (92.1 kg)   SpO2 96%   BMI 29.13 kg/m  General Appearance: Well nourished, in no apparent distress. Head:   Normocephalic and atraumatic. Eyes:  sclerae anicteric,conjunctive pink  Respiratory: Respiratory effort normal, BS equal bilaterally without rales, rhonchi, wheezing. Cardio: RRR with no MRGs. Peripheral pulses intact.  Abdomen: Soft,  Non-distended ,active bowel sounds. No tenderness. Without guarding and Without rebound. No  masses. Rectal: Large right posterior hemorrhoidal skin tag, prolapsing internal hemorrhoids, difficult rectal exam presumed due to large hemorrhoids, no masses appreciated, scant brown stool, positive Hemoccult Musculoskeletal: Full ROM, Normal gait. Without edema. Skin:  Dry and intact without significant lesions or rashes Neuro: Alert and  oriented x4;  No focal deficits. Psych:  Cooperative. Normal mood and affect.    Vladimir Crofts, PA-C 10:07 AM

## 2022-11-07 ENCOUNTER — Ambulatory Visit: Payer: BC Managed Care – PPO | Admitting: Physician Assistant

## 2022-11-07 ENCOUNTER — Encounter: Payer: Self-pay | Admitting: Physician Assistant

## 2022-11-07 VITALS — BP 122/82 | HR 74 | Ht 70.0 in | Wt 203.0 lb

## 2022-11-07 DIAGNOSIS — D509 Iron deficiency anemia, unspecified: Secondary | ICD-10-CM | POA: Diagnosis not present

## 2022-11-07 DIAGNOSIS — K219 Gastro-esophageal reflux disease without esophagitis: Secondary | ICD-10-CM

## 2022-11-07 DIAGNOSIS — K649 Unspecified hemorrhoids: Secondary | ICD-10-CM

## 2022-11-07 DIAGNOSIS — Z800 Family history of malignant neoplasm of digestive organs: Secondary | ICD-10-CM | POA: Diagnosis not present

## 2022-11-07 MED ORDER — HYDROCORTISONE ACETATE 25 MG RE SUPP: 25.0000 mg | Freq: Two times a day (BID) | RECTAL | 0 refills | Status: AC

## 2022-11-07 MED ORDER — HYDROCORTISONE (PERIANAL) 2.5 % EX CREA: 1.0000 | TOPICAL_CREAM | Freq: Two times a day (BID) | CUTANEOUS | 2 refills | Status: AC

## 2022-11-07 MED ORDER — PLENVU 140 G PO SOLR: ORAL | 0 refills | Status: DC

## 2022-11-07 NOTE — Progress Notes (Signed)
Plans for colonoscopy and upper endoscopy to evaluate iron deficiency anemia noted

## 2022-11-07 NOTE — Patient Instructions (Addendum)
Continue iron will schedule EGD and colon to evaluate.   Please do sitz baths- these can be found at the pharmacy. It is a English as a second language teacher that is put in your toliet.  Please increase fiber or add benefiber, increase water and increase acitivity.  Will send in hydrocoritsone suppository, cheapest with GOODRX from [facility], [employer], [employer] or [facility] if your insurance does not pay for it. If the hemorrhoid suppository sent in is too expensive you can do this over the counter trick.  Apply a pea size amount of over the counter Anusol HC cream to the tip of an over the counter PrepH suppository and insert rectally once every night for at least 7 nights.  If this does not improve there are procedures that can be done.   Due to recent changes in healthcare laws, you may see the results of your imaging and laboratory studies on MyChart before your provider has had a chance to review them.  We understand that in some cases there may be results that are confusing or concerning to you. Not all laboratory results come back in the same time frame and the provider may be waiting for multiple results in order to interpret others.  Please give [place] 48 hours in order for your provider to thoroughly review all the results before contacting the office for clarification of your results.    About Hemorrhoids  Hemorrhoids are swollen veins in the lower rectum and anus.  Also called piles, hemorrhoids are a common problem.  Hemorrhoids may be internal (inside the rectum) or external (around the anus).  Internal Hemorrhoids  Internal hemorrhoids are often painless, but they rarely cause bleeding.  The internal veins may stretch and fall down (prolapse) through the anus to the outside of the body.  The veins may then become irritated and painful.  External Hemorrhoids  External hemorrhoids can be easily seen or felt around the anal opening.  They are under the skin around the anus.  When the swollen veins are  scratched or broken by straining, rubbing or wiping they sometimes bleed.  How Hemorrhoids Occur  Veins in the rectum and around the anus tend to swell under pressure.  Hemorrhoids can result from increased pressure in the veins of your anus or rectum.  Some sources of pressure are:  Straining to have a bowel movement because of constipation Waiting too long to have a bowel movement Coughing and sneezing often Sitting for extended periods of time, including on the toilet Diarrhea Obesity Trauma or injury to the anus Some liver diseases Stress Family history of hemorrhoids Pregnancy  Pregnant women should try to avoid becoming constipated, because they are more likely to have hemorrhoids during pregnancy.  In the last trimester of pregnancy, the enlarged uterus may press on blood vessels and causes hemorrhoids.  In addition, the strain of childbirth sometimes causes hemorrhoids after the birth.  Symptoms of Hemorrhoids  Some symptoms of hemorrhoids include: Swelling and/or a tender lump around the anus Itching, mild burning and bleeding around the anus Painful bowel movements with or without constipation Bright red blood covering the stool, on toilet paper or in the toilet bowel.   Symptoms usually go away within a few days.  Always talk to your doctor about any bleeding to make sure it is not from some other causes.  Diagnosing and Treating Hemorrhoids  Diagnosis is made by an examination by your healthcare provider.  Special test can be performed by your doctor.    Most  cases of hemorrhoids can be treated with: High-fiber diet: Eat more high-fiber foods, which help prevent constipation.  Ask for more detailed fiber information on types and sources of fiber from your healthcare provider. Fluids: Drink plenty of water.  This helps soften bowel movements so they are easier to pass. Sitz baths and cold packs: Sitting in lukewarm water two or three times a day for 15 minutes cleases  the anal area and may relieve discomfort.  If the water is too hot, swelling around the anus will get worse.  Placing a cloth-covered ice pack on the anus for ten minutes four times a day can also help reduce selling.  Gently pushing a prolapsed hemorrhoid back inside after the bath or ice pack can be helpful. Medications: For mild discomfort, your healthcare provider may suggest over-the-counter pain medication or prescribe a cream or ointment for topical use.  The cream may contain witch hazel, zinc oxide or petroleum jelly.  Medicated suppositories are also a treatment option.  Always consult your doctor before applying medications or creams. Procedures and surgeries: There are also a number of procedures and surgeries to shrink or remove hemorrhoids in more serious cases.  Talk to your physician about these options.  You can often prevent hemorrhoids or keep them from becoming worse by maintaining a healthy lifestyle.  Eat a fiber-rich diet of fruits, vegetables and whole grains.  Also, drink plenty of water and exercise regularly.   2007, [employer] Doc.30

## 2022-11-22 ENCOUNTER — Encounter: Payer: Self-pay | Admitting: Internal Medicine

## 2022-11-23 ENCOUNTER — Telehealth: Payer: Self-pay

## 2022-11-23 NOTE — Telephone Encounter (Signed)
PA request received via CMM for Plenvu 140GM solution  PA has not been submitted due to alternatives covered.   Key: [id number]

## 2022-11-23 NOTE — Telephone Encounter (Signed)
Noted Plenvu is in cabinet 3rd floor

## 2022-11-23 NOTE — Telephone Encounter (Signed)
Patient returning Evan Phillips's call states his wife will be coming to pick up Plenvu sample.

## 2022-11-23 NOTE — Telephone Encounter (Signed)
Left Message for patient to come pick up a sample prep kit of Plenvu

## 2022-12-05 ENCOUNTER — Encounter: Payer: BC Managed Care – PPO | Admitting: Internal Medicine

## 2022-12-29 ENCOUNTER — Encounter: Payer: Self-pay | Admitting: Internal Medicine

## 2022-12-29 ENCOUNTER — Ambulatory Visit (AMBULATORY_SURGERY_CENTER): Payer: BC Managed Care – PPO | Admitting: Internal Medicine

## 2022-12-29 VITALS — BP 122/80 | HR 71 | Temp 98.6°F | Resp 16 | Ht 70.0 in | Wt 203.0 lb

## 2022-12-29 DIAGNOSIS — Z800 Family history of malignant neoplasm of digestive organs: Secondary | ICD-10-CM

## 2022-12-29 DIAGNOSIS — K5732 Diverticulitis of large intestine without perforation or abscess without bleeding: Secondary | ICD-10-CM

## 2022-12-29 DIAGNOSIS — D509 Iron deficiency anemia, unspecified: Secondary | ICD-10-CM | POA: Diagnosis not present

## 2022-12-29 DIAGNOSIS — K2100 Gastro-esophageal reflux disease with esophagitis, without bleeding: Secondary | ICD-10-CM | POA: Diagnosis present

## 2022-12-29 DIAGNOSIS — K219 Gastro-esophageal reflux disease without esophagitis: Secondary | ICD-10-CM

## 2022-12-29 DIAGNOSIS — K649 Unspecified hemorrhoids: Secondary | ICD-10-CM

## 2022-12-29 MED ORDER — PANTOPRAZOLE SODIUM 40 MG PO TBEC: 40.0000 mg | DELAYED_RELEASE_TABLET | Freq: Every day | ORAL | 11 refills | Status: DC

## 2022-12-29 MED ORDER — SODIUM CHLORIDE 0.9 % IV SOLN: 500.0000 mL | Freq: Once | INTRAVENOUS | Status: DC

## 2022-12-29 NOTE — Progress Notes (Signed)
Pt's states no medical or surgical changes since previsit or office visit. 

## 2022-12-29 NOTE — Progress Notes (Signed)
11/07/2022 Evan Phillips [id number] 15-Dec-1975   Referring provider: Merri Brunette, MD Primary GI doctor: Dr. Marina Goodell   ASSESSMENT AND PLAN:    Iron deficiency anemia, unspecified iron deficiency anemia type New onset 06/2022, corresponding to rectal bleeding With history and physical exam most likely this represents anemia from hemorrhoidal bleeding however due to family history of colon cancer and GERD history will schedule for EGD and colonoscopy to evaluate further. Given hydrocortisone suppositories and cream, information regarding hemorrhoids. Discussed after procedure can potentially schedule for hemorrhoidal banding in the office if amendable.   Gastroesophageal reflux disease without esophagitis Lifestyle changes discussed, avoid NSAIDS, ETOH Continue on the same medication, reports symptoms are well controlled.    Hemorrhoids, unspecified hemorrhoid type -Sitz baths, increase fiber, increase water -Hydrocortisone supp given and external cream sent in.  -We discussed hemorrhoid banding here in the office for internal hemorrhoids if not improving with conservative treatment after colonoscopy   Family history of colon cancer in mother 08/04/2020 colonoscopy with Dr. Marina Goodell for family history of colorectal cancer in mother age 9s previous examination 2016 negative quality of the prep excellent, diverticulosis sigmoid colon, internal hemorrhoids, recall 5 years recall 07/2025       Patient Care Team: Merri Brunette, MD as PCP - General (Internal Medicine)   HISTORY OF PRESENT ILLNESS: 47 y.o. male with a past medical history of HTN, HLD, GERD and others listed below presents for evaluation of IDA.    08/04/2020 colonoscopy with Dr. Marina Goodell for family history of colorectal cancer in mother age 66s previous examination 2016 negative quality of the prep excellent, diverticulosis sigmoid colon, internal hemorrhoids, recall 5 years   He was told he was anemic about 3-4 months ago,  started on oral iron once daily and tolerating well. He states his anemia has improved.  He was feeling weak/fatigued about a year ago but has felt well on the oral iron. No chest pain, no SOB, palpitations, dizziness.  Labs reviewed from primary care: 06/08/2022 hemoglobin 11.7, MCV 75.6, platelets 202, WBC 5.3, iron 28, percent saturation 7, ferritin 9.8 placed on oral iron, and CR 1.47, BUN 12 09/06/2018 4 repeat labs hemoglobin 14.6, MCV 75.8, platelets 218, WBC 7.1   Leading up to his anemia, the patient states  he was having BRB moderate volume for weeks thought to be due from his hemorrhoids. Last saw blood day before yesterday.  Denies rectal pain, burning, itching.  Never tried anything to treat them.  BM every day, no loose stools, no changes in bowel habits.  Has had GERD for years, he is on famotidine and this helps.  Had prednisone 5 days for back injury.  Denies dysphagia, AB pain.  Denies melena, hematochezia.  Has not done stool cards.  Rare NSAIDS.  4-5 beers a week, social.      He  reports that he has never smoked. He has never used smokeless tobacco. He reports current alcohol use of about 4.0 standard drinks of alcohol per week. He reports that he does not use drugs.     Current Medications:    Current Outpatient Medications (Endocrine & Metabolic):    testosterone cypionate (DEPOTESTOTERONE CYPIONATE) 100 MG/ML injection, Inject 1.5 mLs into the muscle every 14 (fourteen) days. For IM use only   Current Outpatient Medications (Cardiovascular):    atorvastatin (LIPITOR) 20 MG tablet,    olmesartan (BENICAR) 40 MG tablet, Take 40 mg by mouth daily.   Current Outpatient Medications (Respiratory):    loratadine (  CLARITIN) 10 MG tablet, Take 10 mg by mouth daily.   Current Outpatient Medications (Analgesics):    traMADol (ULTRAM) 50 MG tablet, 1-2 tablets as needed (may use with tylenol maximum of 3000 mg per day) Orally Once a day for 30 days   Current Outpatient  Medications (Hematological):    Ferrous Sulfate 47.5 MG TBCR, Take 1 tablet by mouth every other day.   Current Outpatient Medications (Other):    famotidine (PEPCID) 20 MG tablet, Take 20 mg by mouth as needed for indigestion or heartburn.   gabapentin (NEURONTIN) 300 MG capsule, Take 1 capsule (300 mg total) by mouth 3 (three) times daily as needed.   hydrocortisone (ANUSOL-HC) 2.5 % rectal cream, Place 1 Application rectally 2 (two) times daily.   hydrocortisone (ANUSOL-HC) 25 MG suppository, Place 1 suppository (25 mg total) rectally 2 (two) times daily.   Multiple Vitamin (MULTIVITAMIN) tablet, Take 1 tablet by mouth daily.   omeprazole (PRILOSEC) 10 MG capsule, Take 10 mg by mouth daily.   PEG-KCl-NaCl-NaSulf-Na Asc-C (PLENVU) 140 g SOLR, As directed by GI Instructions   sertraline (ZOLOFT) 100 MG tablet, Take 100 mg by mouth daily.   Medical History:      Past Medical History:  Diagnosis Date   Allergy     Anxiety     GERD (gastroesophageal reflux disease)     Hyperlipidemia     Hypertension      Allergies: No Known Allergies    Surgical History:  He  has a past surgical history that includes Knee surgery; Wisdom tooth extraction; and Colonoscopy (01/15/2015). Family History:  His family history includes Colon cancer in his mother; Heart disease in his maternal grandfather.   REVIEW OF SYSTEMS  : All other systems reviewed and negative except where noted in the History of Present Illness.   PHYSICAL EXAM: BP 122/82   Pulse 74   Ht  (1.778 m)   Wt 203 lb (92.1 kg)   SpO2 96%   BMI 29.13 kg/m  General Appearance: Well nourished, in no apparent distress. Head:   Normocephalic and atraumatic. Eyes:  sclerae anicteric,conjunctive pink  Respiratory: Respiratory effort normal, BS equal bilaterally without rales, rhonchi, wheezing. Cardio: RRR with no MRGs. Peripheral pulses intact.  Abdomen: Soft,  Non-distended ,active bowel sounds. No tenderness. Without guarding  and Without rebound. No masses. Rectal: Large right posterior hemorrhoidal skin tag, prolapsing internal hemorrhoids, difficult rectal exam presumed due to large hemorrhoids, no masses appreciated, scant brown stool, positive Hemoccult Musculoskeletal: Full ROM, Normal gait. Without edema. Skin:  Dry and intact without significant lesions or rashes Neuro: Alert and  oriented x4;  No focal deficits. Psych:  Cooperative. Normal mood and affect.      Doree Albee, PA-C 10:07 AM   Recent H&P as above.  No interval change.  Now for colonoscopy and upper endoscopy

## 2022-12-29 NOTE — Op Note (Signed)
[facility] Patient Name: Evan Phillips Procedure Date: 12/29/2022 1:32 PM MRN: [id number] Endoscopist: Wilhemina Bonito. Marina Goodell , MD, [id number] Age: 47 Referring MD:  Date of Birth: 09-Nov-1975 Gender: Male Account #: [id number] Procedure:                Colonoscopy Indications:              Iron deficiency anemia. Previous examinations 2016,                            2021. Negative for neoplasia Medicines:                Monitored Anesthesia Care Procedure:                Pre-Anesthesia Assessment:                           - Prior to the procedure, a History and Physical                            was performed, and patient medications and                            allergies were reviewed. The patient's tolerance of                            previous anesthesia was also reviewed. The risks                            and benefits of the procedure and the sedation                            options and risks were discussed with the patient.                            All questions were answered, and informed consent                            was obtained. Prior Anticoagulants: The patient has                            taken no anticoagulant or antiplatelet agents. ASA                            Grade Assessment: II - A patient with mild systemic                            disease. After reviewing the risks and benefits,                            the patient was deemed in satisfactory condition to                            undergo the procedure.  After obtaining informed consent, the colonoscope                            was passed under direct vision. Throughout the                            procedure, the patient's blood pressure, pulse, and                            oxygen saturations were monitored continuously. The                            CF [id number] #[id number] was introduced through the anus                            and advanced to the the  cecum, identified by                            appendiceal orifice and ileocecal valve. The                            ileocecal valve, appendiceal orifice, and rectum                            were photographed. The quality of the bowel                            preparation was excellent. The colonoscopy was                            performed without difficulty. The patient tolerated                            the procedure well. The bowel preparation used was                            SUPREP via split dose instruction. Scope In: 1:55:44 PM Scope Out: 2:06:48 PM Scope Withdrawal Time: 0 hours 7 minutes 53 seconds  Total Procedure Duration: 0 hours 11 minutes 4 seconds  Findings:                 Multiple diverticula were found in the sigmoid                            colon.                           Internal hemorrhoids were found during                            retroflexion. The hemorrhoids were large                            prolapsing. Rectal veins were prominent.  The exam was otherwise without abnormality on                            direct and retroflexion views. Complications:            No immediate complications. Estimated blood loss:                            None. Estimated Blood Loss:     Estimated blood loss: none. Impression:               - Diverticulosis in the sigmoid colon.                           - Internal hemorrhoids.                           - Prominent rectal veins                           - The examination was otherwise normal on direct                            and retroflexion views.                           - I suspect that his iron deficiency anemia is on                            the basis of chronic significant rectal bleeding                            from large hemorrhoids. These are not amenable to                            endoscopic banding. Recommendation:           - Repeat colonoscopy in 10 years for  screening                            purposes.                           - Patient has a contact number available for                            emergencies. The signs and symptoms of potential                            delayed complications were discussed with the                            patient. Return to normal activities tomorrow.                            Written discharge instructions were provided to the  patient.                           - Resume previous diet.                           - Continue present medications.                           - PLEASE REFER TO COLORECTAL SURGERY                            "hemorrhoidectomy". Wilhemina Bonito. Marina Goodell, MD 12/29/2022 2:14:06 PM This report has been signed electronically.

## 2022-12-29 NOTE — Op Note (Signed)
[facility] Patient Name: Evan Phillips Procedure Date: 12/29/2022 1:31 PM MRN: [id number] Endoscopist: Wilhemina Bonito. Marina Goodell , MD, [id number] Age: 47 Referring MD:  Date of Birth: 1975/11/06 Gender: Male Account #: [id number] Procedure:                Upper GI endoscopy Indications:              Iron deficiency anemia, Esophageal reflux Medicines:                Monitored Anesthesia Care Procedure:                Pre-Anesthesia Assessment:                           - Prior to the procedure, a History and Physical                            was performed, and patient medications and                            allergies were reviewed. The patient's tolerance of                            previous anesthesia was also reviewed. The risks                            and benefits of the procedure and the sedation                            options and risks were discussed with the patient.                            All questions were answered, and informed consent                            was obtained. Prior Anticoagulants: The patient has                            taken no anticoagulant or antiplatelet agents.                            After reviewing the risks and benefits, the patient                            was deemed in satisfactory condition to undergo the                            procedure.                           After obtaining informed consent, the endoscope was                            passed under direct vision. Throughout the  procedure, the patient's blood pressure, pulse, and                            oxygen saturations were monitored continuously. The                            GIF [id number] #[id number] was introduced through the                            mouth, and advanced to the second part of duodenum.                            The upper GI endoscopy was accomplished without                            difficulty. The patient  tolerated the procedure                            well. Scope In: Scope Out: Findings:                 The esophagus revealed esophagitis with small                            distal erosions and edema. No Barrett's.                           The stomach revealed a hiatal hernia but was                            otherwise normal.                           The examined duodenum was normal.                           The cardia and gastric fundus were normal on                            retroflexion. Complications:            No immediate complications. Estimated Blood Loss:     Estimated blood loss: none. Impression:               1. GERD with esophagitis. Recommendation:           - Patient has a contact number available for                            emergencies. The signs and symptoms of potential                            delayed complications were discussed with the                            patient. Return to normal activities tomorrow.  Written discharge instructions were provided to the                            patient.                           - Resume previous diet.                           - Continue present medications.                           - Prescribed pantoprazole 40 mg daily; #30; 11                            refills Lilley Hubble N. Marina Goodell, MD 12/29/2022 2:22:58 PM This report has been signed electronically.

## 2022-12-29 NOTE — Patient Instructions (Signed)
YOU HAD AN ENDOSCOPIC PROCEDURE TODAY AT THE [facility]:   Refer to the procedure report that was given to you for any specific questions about what was found during the examination.  If the procedure report does not answer your questions, please call your gastroenterologist to clarify.  If you requested that your care partner not be given the details of your procedure findings, then the procedure report has been included in a sealed envelope for you to review at your convenience later.  YOU SHOULD EXPECT: Some feelings of bloating in the abdomen. Passage of more gas than usual.  Walking can help get rid of the air that was put into your GI tract during the procedure and reduce the bloating. If you had a lower endoscopy (such as a colonoscopy or flexible sigmoidoscopy) you may notice spotting of blood in your stool or on the toilet paper. If you underwent a bowel prep for your procedure, you may not have a normal bowel movement for a few days.  Please Note:  You might notice some irritation and congestion in your nose or some drainage.  This is from the oxygen used during your procedure.  There is no need for concern and it should clear up in a day or so.  SYMPTOMS TO REPORT IMMEDIATELY:  Following lower endoscopy (colonoscopy or flexible sigmoidoscopy):  Excessive amounts of blood in the stool  Significant tenderness or worsening of abdominal pains  Swelling of the abdomen that is new, acute  Fever of 100F or higher  Following upper endoscopy (EGD)  Vomiting of blood or coffee ground material  New chest pain or pain under the shoulder blades  Painful or persistently difficult swallowing  New shortness of breath  Fever of 100F or higher  Black, tarry-looking stools  For urgent or emergent issues, a gastroenterologist can be reached at any hour by calling [phone]. Do not use MyChart messaging for urgent concerns.    DIET:  We do recommend a small meal at first, but  then you may proceed to your regular diet.  Drink plenty of fluids but you should avoid alcoholic beverages for 24 hours.  ACTIVITY:  You should plan to take it easy for the rest of today and you should NOT DRIVE or use heavy machinery until tomorrow (because of the sedation medicines used during the test).    FOLLOW UP: Our staff will call the number listed on your records the next business day following your procedure.  We will call around 7:15- 8:00 am to check on you and address any questions or concerns that you may have regarding the information given to you following your procedure. If we do not reach you, we will leave a message.     If any biopsies were taken you will be contacted by phone or by letter within the next 1-3 weeks.  Please call us at [phone] if you have not heard about the biopsies in 3 weeks.    SIGNATURES/CONFIDENTIALITY: You and/or your care partner have signed paperwork which will be entered into your electronic medical record.  These signatures attest to the fact that that the information above on your After Visit Summary has been reviewed and is understood.  Full responsibility of the confidentiality of this discharge information lies with you and/or your care-partner. 

## 2022-12-29 NOTE — Progress Notes (Signed)
Sedate, gd SR, tolerated procedure well, VSS, report to RN 

## 2022-12-30 ENCOUNTER — Telehealth: Payer: Self-pay | Admitting: *Deleted

## 2022-12-30 NOTE — Telephone Encounter (Signed)
  Follow up Call-     12/29/2022   12:54 PM 08/04/2020    9:24 AM  Call back number  Post procedure Call Back phone  # [phone]  Permission to leave phone message Yes Yes     Patient questions:  Do you have a fever, pain , or abdominal swelling? No. Pain Score  0 *  Have you tolerated food without any problems? Yes.    Have you been able to return to your normal activities? Yes.    Do you have any questions about your discharge instructions: Diet   No. Medications  No. Follow up visit  No.  Do you have questions or concerns about your Care? No.  Actions: * If pain score is 4 or above: No action needed, pain <4.

## 2023-01-23 ENCOUNTER — Ambulatory Visit: Payer: Self-pay | Admitting: Surgery

## 2023-01-23 DIAGNOSIS — K643 Fourth degree hemorrhoids: Secondary | ICD-10-CM | POA: Insufficient documentation

## 2023-01-23 DIAGNOSIS — D500 Iron deficiency anemia secondary to blood loss (chronic): Secondary | ICD-10-CM | POA: Insufficient documentation

## 2023-12-06 ENCOUNTER — Ambulatory Visit: Payer: Self-pay | Admitting: Family Medicine

## 2023-12-06 ENCOUNTER — Ambulatory Visit (INDEPENDENT_AMBULATORY_CARE_PROVIDER_SITE_OTHER)

## 2023-12-06 ENCOUNTER — Encounter: Payer: Self-pay | Admitting: Family Medicine

## 2023-12-06 ENCOUNTER — Other Ambulatory Visit: Payer: Self-pay

## 2023-12-06 VITALS — BP 150/90 | HR 82 | Ht 70.0 in

## 2023-12-06 DIAGNOSIS — M25551 Pain in right hip: Secondary | ICD-10-CM

## 2023-12-06 NOTE — Patient Instructions (Signed)
 Thank you for coming in today.   Please get an Xray today before you leave   At the check out desk, schedule your first physical therapy visits, before you leave today.  Check back in 6 weeks

## 2023-12-06 NOTE — Progress Notes (Unsigned)
   I, Stevenson Clinch, CMA acting as a scribe for Clementeen Graham, MD.  Evan Phillips is a 48 y.o. male who presents to [facility] at [facility] today for R hip pain. Pt was last seen by Dr. Denyse Amass on 10/26/22 for LBP.  Today, pt reports right hip pain which has progressively worsened since winter. . Pt locates pain to lateral and posterior radiating into the right side and anterior thigh. Denies weakness in R LE. Hx of low back pain with sciatica. Sx worse first thing in the mornings but improves with stretching. Sx worse with prolonged time walking. Remains active [employer].   Radiating pain: R LE LE numbness/tingling: occasional tingling in R LE LE weakness: denies Aggravates: prolonged standing Treatments tried:  Dx imaging: 10/26/22 L-spine XR  Pertinent review of systems: No fevers or chills  Relevant historical information: History of anemia   Exam:  BP (!) 150/90   Pulse 82   Ht 5\' 10"  (1.778 m)   SpO2 97%   BMI 29.13 kg/m  General: Well Developed, well nourished, and in no acute distress.   MSK: Right hip normal-appearing decreased range of motion.  Hip abduction strength is diminished.    Lab and Radiology Results  X-ray images right hip obtained today personally and independently interpreted. Femoral acetabular DJD with FAI pattern.  Old avulsion fragment of the anterior superior acetabular spur is visible. Await formal radiology review    Assessment and Plan: 48 y.o. male with chronic right hip pain.  Previous visit was more back pain.  Today he additionally has some hip pain.  Plan for physical therapy trial.  Hip x-ray does show DJD and FAI.  Work on core strengthening and hip strengthening.  Recheck in 6 weeks.   PDMP not reviewed this encounter. Orders Placed This Encounter  Procedures   [place] LIMITED JOINT SPACE STRUCTURES LOW RIGHT(NO LINKED CHARGES)    Reason for Exam (SYMPTOM  OR DIAGNOSIS REQUIRED):   right hip pain    Preferred  imaging location?:   [facility]   DG HIP UNILAT W OR W/O PELVIS 2-3 VIEWS RIGHT    Standing Status:   Future    Number of Occurrences:   1    Expiration Date:   01/05/2024    Reason for Exam (SYMPTOM  OR DIAGNOSIS REQUIRED):   glute weakness    Preferred imaging location?:   [place] [facility]   Ambulatory referral to Physical Therapy    Referral Priority:   Routine    Referral Type:   Physical Medicine    Referral Reason:   Specialty Services Required    Requested Specialty:   Physical Therapy    Number of Visits Requested:   1   No orders of the defined types were placed in this encounter.    Discussed warning signs or symptoms. Please see discharge instructions. Patient expresses understanding.   The above documentation has been reviewed and is accurate and complete Clementeen Graham, M.D.

## 2023-12-11 NOTE — Therapy (Signed)
 OUTPATIENT PHYSICAL THERAPY EVALUATION   Patient Name: Evan Phillips MRN: [id number] DOB:24-May-1976, 48 y.o., male Today's Date: 12/13/2023   END OF SESSION:  PT End of Session - 12/12/23 1442     Visit Number 1    Number of Visits 9    Date for PT Re-Evaluation 02/06/24    Authorization Type [employer]et[employer]    PT Start Time 1435    PT Stop Time 1520    PT Time Calculation (min) 45 min    Activity Tolerance Patient tolerated treatment well    Behavior During Therapy [facility] for tasks assessed/performed             Past Medical History:  Diagnosis Date   Allergy    Anxiety    GERD (gastroesophageal reflux disease)    Hyperlipidemia    Hypertension    Past Surgical History:  Procedure Laterality Date   COLONOSCOPY  01/15/2015   Marina Goodell hyperplastic polyp   KNEE SURGERY     WISDOM TOOTH EXTRACTION     Patient Active Problem List   Diagnosis Date Noted   Iron deficiency anemia due to chronic blood loss 01/23/2023   Prolapsed internal hemorrhoids, grade 4 01/23/2023    PCP: Merri Brunette, MD  REFERRING PROVIDER: Rodolph Bong, MD  REFERRING DIAG: Right hip pain  THERAPY DIAG:  Pain in right hip  Other low back pain  Muscle weakness (generalized)  Rationale for Evaluation and Treatment: Rehabilitation  ONSET DATE: Chronic, 1-1.5 years   SUBJECTIVE:  SUBJECTIVE STATEMENT: Patient reports initially right hip pain in the front that would cause him to lean his upper body to the left and he would have to shift his hips back to the left to straighten up. Reports that since he has straightened his posture he has noticed pain more in the right lower back and more in the back of the hip rather than the front. Pain mainly when walking long distances or standing extended periods. He reports no mechanism of injury. He will get some cramping in the right side and lower back if he is working upright for a while. He does do some light jiu-jitsu and sparring, and does lift weights.  Denies any pain down the leg.   PERTINENT HISTORY: See PMH above  PAIN:  Are you having pain? Yes:  NPRS scale: 3-4/10, 6/10 at worst Pain location: Right hip Pain description: Muscle pain Aggravating factors: Standing, walking Relieving factors: Stretching  PRECAUTIONS: None  RED FLAGS: None   WEIGHT BEARING RESTRICTIONS: No  FALLS:  Has patient fallen in last 6 months? No  PLOF: Independent  PATIENT GOALS: Stop hurting, stand up straight, be able to walk   OBJECTIVE:  Note: Objective measures were completed at Evaluation unless otherwise noted. PATIENT SURVEYS:  PSFS: 7 Standing for long periods: 5 Walking long distances: 8 Sparring: 8  COGNITION: Overall cognitive status: Within functional limits for tasks assessed     SENSATION: WFL  MUSCLE LENGTH: Slight limitations with bilateral hamstring length, right piriformis tightness  POSTURE:   Mild right lateral shift  PALPATION: Non tender to right lumbar paraspinals and hip region  LUMBAR ROM:   Active  A/PROM  eval  Flexion 75%  Extension 75%  Right lateral flexion WFL  Left lateral flexion WFL  Right rotation WFL  Left rotation WFL   (Blank rows = not tested)  Reports twinge right buttock region with extension, feeling of right sided weakness with side bend and rotations  LOWER EXTREMITY ROM:  Hip PROM grossly WFL  LOWER EXTREMITY MMT:  MMT Right eval Left eval  Hip flexion 5 5  Hip extension 4- 4  Hip abduction 4- 4  Hip adduction    Hip internal rotation    Hip external rotation    Knee flexion 5 5  Knee extension 5 5  Ankle dorsiflexion    Ankle plantarflexion    Ankle inversion    Ankle eversion     (Blank rows = not tested)  LOWER EXTREMITY SPECIAL TESTS:  Slump positive on right  FUNCTIONAL TESTS:  DLLT: loss of lumbar control at 45 deg   GAIT: Assistive device utilized: None Level of assistance: Complete Independence Comments: Patient ambulates with mild right  lateral shift                                                                                                                               TREATMENT [facility] Adult PT Treatment:                                                DATE: 12/12/2023 Standing left lateral shift at wall Piriformis stretch Side clamshell with green Bridge Modified side plank  PATIENT EDUCATION:  Education details: Exam findings, POC, HEP Person educated: Patient Education method: Programmer, multimedia, Demonstration, Tactile cues, Verbal cues, and Handouts Education comprehension: verbalized understanding, returned demonstration, verbal cues required, tactile cues required, and needs further education  HOME EXERCISE PROGRAM: Access Code: [email or web address]    ASSESSMENT: CLINICAL IMPRESSION: Patient is a 48 y.o. male who was seen today for physical therapy evaluation and treatment for chronic right hip and lower back pain. Currently he does not exhibit any radicular symptoms on the right but does seem to have some positive nerve tension with slump test. He exhibits a right lateral shift and is able to correct without pain. He exhibit slight limitations in lumbar motion and gross strength deficits of the core and hips that is likely contributing to his pain and impacting his functional ability.   OBJECTIVE IMPAIRMENTS: decreased activity tolerance, decreased ROM, decreased strength, impaired flexibility, postural dysfunction, and pain.   ACTIVITY LIMITATIONS: lifting, standing, and locomotion level  PARTICIPATION LIMITATIONS: shopping and community activity  PERSONAL FACTORS: Fitness, Past/current experiences, and Time since onset of injury/illness/exacerbation are also affecting patient's functional outcome.   REHAB POTENTIAL: Good  CLINICAL DECISION MAKING: Stable/uncomplicated  EVALUATION COMPLEXITY: Low   GOALS: Goals reviewed with patient? Yes  SHORT TERM GOALS: Target date: 01/09/2024  Patient will be I with initial HEP  in order to progress with therapy. Baseline: HEP provided at eval Goal status: INITIAL  2.  Patient will report right lower back and hip pain </= 3/10 with walking and activity in order to reduce functional limitations Baseline: 6/10 Goal status: INITIAL  LONG TERM GOALS: Target date:  02/06/2024  Patient will be I with final HEP to maintain progress from PT. Baseline: HEP provided at eval Goal status: INITIAL  2.  Patient will report PSFS >/= 9 in order to indicate an improvement in his functional ability Baseline: 7 Goal status: INITIAL  3.  Patient will demonstrate hip strength >/= 4+/5 MMT in order to improve postural control and standing and walking tolerance Baseline: see limitations above Goal status: INITIAL  4.  Patient will demonstrate DLLT </= 30 deg in order to indicate improved core control in order to reduce pain and limitation with sparring Baseline: 45 deg Goal status: INITIAL   PLAN: PT FREQUENCY: 1x/week  PT DURATION: 8 weeks  PLANNED INTERVENTIONS: 97164- PT Re-evaluation, 97110-Therapeutic exercises, 97530- Therapeutic activity, 97112- Neuromuscular re-education, 97535- Self Care, 16109- Manual therapy, 97032- Electrical stimulation (manual), Patient/Family education, Taping, Dry Needling, Joint mobilization, Joint manipulation, Spinal manipulation, Spinal mobilization, Cryotherapy, and Moist heat  PLAN FOR NEXT SESSION: Review HEP and progress PRN, continue progression of core and hip strengthening, manual/TPDN for right hip and lower back region PRN, right nerve glides PRN   Rosana Hoes, PT, DPT, LAT, ATC 12/13/23  7:56 AM Phone: [phone] Fax: [phone]

## 2023-12-12 ENCOUNTER — Ambulatory Visit: Admitting: Physical Therapy

## 2023-12-12 ENCOUNTER — Encounter: Payer: Self-pay | Admitting: Physical Therapy

## 2023-12-12 ENCOUNTER — Other Ambulatory Visit: Payer: Self-pay

## 2023-12-12 DIAGNOSIS — M5459 Other low back pain: Secondary | ICD-10-CM | POA: Diagnosis not present

## 2023-12-12 DIAGNOSIS — M6281 Muscle weakness (generalized): Secondary | ICD-10-CM | POA: Diagnosis not present

## 2023-12-12 DIAGNOSIS — M25551 Pain in right hip: Secondary | ICD-10-CM | POA: Diagnosis not present

## 2023-12-12 NOTE — Patient Instructions (Signed)
 Access Code: [email or web address] URL: [email or web address] Date: 12/12/2023 Prepared by: Rosana Hoes  Exercises - Left Standing Lateral Shift Correction at Wall - Repetitions  - 1 x daily - 10 reps - 3 seconds hold - Supine Piriformis Stretch with Foot on Ground  - 1 x daily - 3 reps - 20 seconds hold - Clam with Resistance  - 1 x daily - 3 sets - 15 reps - Supine Bridge  - 1 x daily - 2 sets - 10 reps - 5 seconds hold - Side Plank on Knees  - 1 x daily - 5 reps - 20 seconds hold

## 2023-12-19 ENCOUNTER — Encounter: Payer: Self-pay | Admitting: Physical Therapy

## 2023-12-19 ENCOUNTER — Ambulatory Visit: Admitting: Physical Therapy

## 2023-12-19 ENCOUNTER — Other Ambulatory Visit: Payer: Self-pay

## 2023-12-19 DIAGNOSIS — M6281 Muscle weakness (generalized): Secondary | ICD-10-CM

## 2023-12-19 DIAGNOSIS — M25551 Pain in right hip: Secondary | ICD-10-CM | POA: Diagnosis not present

## 2023-12-19 DIAGNOSIS — M5459 Other low back pain: Secondary | ICD-10-CM

## 2023-12-19 NOTE — Patient Instructions (Signed)
 Access Code: [email or web address] URL: [email or web address] Date: 12/19/2023 Prepared by: Leah Primus  Exercises - Left Standing Lateral Shift Correction at Wall - Repetitions  - 1 x daily - 10 reps - 3 seconds hold - Supine Piriformis Stretch with Foot on Ground  - 1 x daily - 3 reps - 20 seconds hold - Clam with Resistance  - 1 x daily - 3 sets - 15 reps - Supine Bridge with Resistance Band  - 1 x daily - 2 sets - 10 reps - 5 seconds hold - Side Plank on Elbow  - 1 x daily - 3 reps - 20 seconds hold - Standard Plank  - 1 x daily - 3 reps - 20 seconds hold - Standing Anti-Rotation Press with Anchored Resistance  - 1 x daily - 3 sets - 10 reps

## 2023-12-19 NOTE — Therapy (Signed)
 OUTPATIENT PHYSICAL THERAPY TREATMENT   Patient Name: Evan Phillips MRN: [id number] DOB:March 06, 1976, 48 y.o., male Today's Date: 12/19/2023   END OF SESSION:  PT End of Session - 12/19/23 1603     Visit Number 2    Number of Visits 9    Date for PT Re-Evaluation 02/06/24    Authorization Type Aetna    PT Start Time 1518    PT Stop Time 1558    PT Time Calculation (min) 40 min    Activity Tolerance Patient tolerated treatment well    Behavior During Therapy [facility] for tasks assessed/performed              Past Medical History:  Diagnosis Date   Allergy    Anxiety    GERD (gastroesophageal reflux disease)    Hyperlipidemia    Hypertension    Past Surgical History:  Procedure Laterality Date   COLONOSCOPY  01/15/2015   Marina Goodell hyperplastic polyp   KNEE SURGERY     WISDOM TOOTH EXTRACTION     Patient Active Problem List   Diagnosis Date Noted   Iron deficiency anemia due to chronic blood loss 01/23/2023   Prolapsed internal hemorrhoids, grade 4 01/23/2023    PCP: Merri Brunette, MD  REFERRING PROVIDER: Rodolph Bong, MD  REFERRING DIAG: Right hip pain  THERAPY DIAG:  Pain in right hip  Other low back pain  Muscle weakness (generalized)  Rationale for Evaluation and Treatment: Rehabilitation  ONSET DATE: Chronic, 1-1.5 years   SUBJECTIVE:  SUBJECTIVE STATEMENT: Patient reports he doesn't hurt in the front of the right hip anymore, it is more consistent in the back of the hip. He was able to sparr yesterday without cramping on the right side.   Eval: Patient reports initially right hip pain in the front that would cause him to lean his upper body to the left and he would have to shift his hips back to the left to straighten up. Reports that since he has straightened his posture he has noticed pain more in the right lower back and more in the back of the hip rather than the front. Pain mainly when walking long distances or standing extended periods. He  reports no mechanism of injury. He will get some cramping in the right side and lower back if he is working upright for a while. He does do some light jiu-jitsu and sparring, and does lift weights. Denies any pain down the leg.   PERTINENT HISTORY: See PMH above  PAIN:  Are you having pain? Yes:  NPRS scale: 0/10 currently, 3/10 at worst Pain location: Right hip Pain description: Muscle pain Aggravating factors: Standing, walking Relieving factors: Stretching  PRECAUTIONS: None  PATIENT GOALS: Stop hurting, stand up straight, be able to walk   OBJECTIVE:  Note: Objective measures were completed at Evaluation unless otherwise noted. PATIENT SURVEYS:  PSFS: 7 Standing for long periods: 5 Walking long distances: 8 Sparring: 8  MUSCLE LENGTH: Slight limitations with bilateral hamstring length, right piriformis tightness  POSTURE:   Mild right lateral shift  PALPATION: Non tender to right lumbar paraspinals and hip region  LUMBAR ROM:   Active  A/PROM  eval  Flexion 75%  Extension 75%  Right lateral flexion WFL  Left lateral flexion WFL  Right rotation WFL  Left rotation WFL   (Blank rows = not tested)  Reports twinge right buttock region with extension, feeling of right sided weakness with side bend and rotations  LOWER EXTREMITY ROM:  Hip PROM grossly WFL  LOWER EXTREMITY MMT:  MMT Right eval Left eval  Hip flexion 5 5  Hip extension 4- 4  Hip abduction 4- 4  Hip adduction    Hip internal rotation    Hip external rotation    Knee flexion 5 5  Knee extension 5 5  Ankle dorsiflexion    Ankle plantarflexion    Ankle inversion    Ankle eversion     (Blank rows = not tested)  LOWER EXTREMITY SPECIAL TESTS:  Slump positive on right  FUNCTIONAL TESTS:  DLLT: loss of lumbar control at 45 deg   GAIT: Assistive device utilized: None Level of assistance: Complete Independence Comments: Patient ambulates with mild right lateral shift                                                                                                                                TREATMENT [facility] Adult PT Treatment:                                                DATE: 12/19/2023 Piriformis stretch x 30 sec each Bridge with blue at knees 2 x 10 Side clamshell with blue 2 x 15 Front plank 3 x 30 sec Side plank x 30 sec each Pallof press with blue 2 x 10 Deadlift from 4" box  30# x 8, 40# x 8, 50# x 8  PATIENT EDUCATION:  Education details: HEP update Person educated: Patient Education method: Explanation, Demonstration, Tactile cues, Verbal cues, and Handouts Education comprehension: verbalized understanding, returned demonstration, verbal cues required, tactile cues required, and needs further education  HOME EXERCISE PROGRAM: Access Code: [email or web address]    ASSESSMENT: CLINICAL IMPRESSION: Patient tolerated therapy well with no adverse effects. Therapy focused primarily on progressing core stabilization and hip strengthening with good tolerance. He did report bilateral lower back pain with bridge so cued for initiating movement with pelvic tilt and added band around knees which improved his pain. Incorporated more upright core strengthening and lifting with good tolerance. He does require occasional cueing for lateral shift but is able to correct. Updated his HEP to progress core strengthening for home. Patient would benefit from continued skilled PT to progress mobility and strength in order to reduce pain and maximize functional ability.   Eval: Patient is a 48 y.o. male who was seen today for physical therapy evaluation and treatment for chronic right hip and lower back pain. Currently he does not exhibit any radicular symptoms on the right but does seem to have some positive nerve tension with slump test. He exhibits a right lateral shift and is able to correct without pain. He exhibit slight limitations in lumbar motion and gross strength deficits of the core and  hips that is likely contributing to his pain and impacting his  functional ability.   OBJECTIVE IMPAIRMENTS: decreased activity tolerance, decreased ROM, decreased strength, impaired flexibility, postural dysfunction, and pain.   ACTIVITY LIMITATIONS: lifting, standing, and locomotion level  PARTICIPATION LIMITATIONS: shopping and community activity  PERSONAL FACTORS: Fitness, Past/current experiences, and Time since onset of injury/illness/exacerbation are also affecting patient's functional outcome.    GOALS: Goals reviewed with patient? Yes  SHORT TERM GOALS: Target date: 01/09/2024  Patient will be I with initial HEP in order to progress with therapy. Baseline: HEP provided at eval Goal status: INITIAL  2.  Patient will report right lower back and hip pain </= 3/10 with walking and activity in order to reduce functional limitations Baseline: 6/10 Goal status: INITIAL  LONG TERM GOALS: Target date: 02/06/2024  Patient will be I with final HEP to maintain progress from PT. Baseline: HEP provided at eval Goal status: INITIAL  2.  Patient will report PSFS >/= 9 in order to indicate an improvement in his functional ability Baseline: 7 Goal status: INITIAL  3.  Patient will demonstrate hip strength >/= 4+/5 MMT in order to improve postural control and standing and walking tolerance Baseline: see limitations above Goal status: INITIAL  4.  Patient will demonstrate DLLT </= 30 deg in order to indicate improved core control in order to reduce pain and limitation with sparring Baseline: 45 deg Goal status: INITIAL   PLAN: PT FREQUENCY: 1x/week  PT DURATION: 8 weeks  PLANNED INTERVENTIONS: 97164- PT Re-evaluation, 97110-Therapeutic exercises, 97530- Therapeutic activity, 97112- Neuromuscular re-education, 97535- Self Care, 16109- Manual therapy, 97032- Electrical stimulation (manual), Patient/Family education, Taping, Dry Needling, Joint mobilization, Joint manipulation, Spinal  manipulation, Spinal mobilization, Cryotherapy, and Moist heat  PLAN FOR NEXT SESSION: Review HEP and progress PRN, continue progression of core and hip strengthening, manual/TPDN for right hip and lower back region PRN, right nerve glides PRN   Leah Primus, PT, DPT, LAT, ATC 12/19/23  4:05 PM Phone: [phone] Fax: [phone]

## 2023-12-25 NOTE — Progress Notes (Signed)
 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

## 2023-12-26 ENCOUNTER — Other Ambulatory Visit: Payer: Self-pay

## 2023-12-26 ENCOUNTER — Ambulatory Visit: Admitting: Physical Therapy

## 2023-12-26 ENCOUNTER — Encounter: Payer: Self-pay | Admitting: Physical Therapy

## 2023-12-26 DIAGNOSIS — M5459 Other low back pain: Secondary | ICD-10-CM | POA: Diagnosis not present

## 2023-12-26 DIAGNOSIS — M6281 Muscle weakness (generalized): Secondary | ICD-10-CM

## 2023-12-26 DIAGNOSIS — M25551 Pain in right hip: Secondary | ICD-10-CM | POA: Diagnosis not present

## 2023-12-26 NOTE — Patient Instructions (Signed)
 Access Code: [email or web address] URL: [email or web address] Date: 12/26/2023 Prepared by: Leah Primus  Exercises - Left Standing Lateral Shift Correction at Wall - Repetitions  - 1 x daily - 10 reps - 3 seconds hold - Supine Piriformis Stretch with Foot on Ground  - 1 x daily - 3 reps - 20 seconds hold - Figure 4 Bridge  - 1 x daily - 3 sets - 10 reps - Side Plank on Elbow  - 1 x daily - 3 reps - 20 seconds hold - Standard Plank  - 1 x daily - 3 reps - 20 seconds hold - Standing Anti-Rotation Press with Anchored Resistance  - 1 x daily - 3 sets - 10 reps - Side Stepping with Resistance at Thighs  - 1 x daily - 3 sets - 20 reps  Added lumbar extension SNAG with strap

## 2023-12-26 NOTE — Therapy (Signed)
 OUTPATIENT PHYSICAL THERAPY TREATMENT   Patient Name: Evan Phillips MRN: [id number] DOB:December 03, 1975, 48 y.o., male Today's Date: 12/26/2023   END OF SESSION:  PT End of Session - 12/26/23 1031     Visit Number 3    Number of Visits 9    Date for PT Re-Evaluation 02/06/24    Authorization Type [employer]    PT Start Time 1016    PT Stop Time 1056    PT Time Calculation (min) 40 min    Activity Tolerance Patient tolerated treatment well    Behavior During Therapy [facility] for tasks assessed/performed               Past Medical History:  Diagnosis Date   Allergy    Anxiety    GERD (gastroesophageal reflux disease)    Hyperlipidemia    Hypertension    Past Surgical History:  Procedure Laterality Date   COLONOSCOPY  01/15/2015   Elvin Hammer hyperplastic polyp   KNEE SURGERY     WISDOM TOOTH EXTRACTION     Patient Active Problem List   Diagnosis Date Noted   Iron deficiency anemia due to chronic blood loss 01/23/2023   Prolapsed internal hemorrhoids, grade 4 01/23/2023    PCP: Imelda Man, MD  REFERRING PROVIDER: Syliva Even, MD  REFERRING DIAG: Right hip pain  THERAPY DIAG:  Pain in right hip  Other low back pain  Muscle weakness (generalized)  Rationale for Evaluation and Treatment: Rehabilitation  ONSET DATE: Chronic, 1-1.5 years   SUBJECTIVE:  SUBJECTIVE STATEMENT: Patient reports he is feeling a lot better. He has stopped hurting at all in front of the right hip. He is able to stay upright better but does feel inflexible in the lower back region.   Eval: Patient reports initially right hip pain in the front that would cause him to lean his upper body to the left and he would have to shift his hips back to the left to straighten up. Reports that since he has straightened his posture he has noticed pain more in the right lower back and more in the back of the hip rather than the front. Pain mainly when walking long distances or standing extended periods. He  reports no mechanism of injury. He will get some cramping in the right side and lower back if he is working upright for a while. He does do some light jiu-jitsu and sparring, and does lift weights. Denies any pain down the leg.   PERTINENT HISTORY: See PMH above  PAIN:  Are you having pain? Yes:  NPRS scale: 0/10 currently, 3/10 at worst Pain location: Right hip Pain description: Muscle pain Aggravating factors: Standing, walking Relieving factors: Stretching  PRECAUTIONS: None  PATIENT GOALS: Stop hurting, stand up straight, be able to walk   OBJECTIVE:  Note: Objective measures were completed at Evaluation unless otherwise noted. PATIENT SURVEYS:  PSFS: 7 Standing for long periods: 5 Walking long distances: 8 Sparring: 8  MUSCLE LENGTH: Slight limitations with bilateral hamstring length, right piriformis tightness  POSTURE:   Mild right lateral shift  PALPATION: Non tender to right lumbar paraspinals and hip region  LUMBAR ROM:   Active  A/PROM  eval  Flexion 75%  Extension 75%  Right lateral flexion WFL  Left lateral flexion WFL  Right rotation WFL  Left rotation WFL   (Blank rows = not tested)  Reports twinge right buttock region with extension, feeling of right sided weakness with side bend and rotations  LOWER EXTREMITY  ROM:   Hip PROM grossly WFL  LOWER EXTREMITY MMT:  MMT Right eval Left eval  Hip flexion 5 5  Hip extension 4- 4  Hip abduction 4- 4  Hip adduction    Hip internal rotation    Hip external rotation    Knee flexion 5 5  Knee extension 5 5  Ankle dorsiflexion    Ankle plantarflexion    Ankle inversion    Ankle eversion     (Blank rows = not tested)  LOWER EXTREMITY SPECIAL TESTS:  Slump positive on right  FUNCTIONAL TESTS:  DLLT: loss of lumbar control at 45 deg   GAIT: Assistive device utilized: None Level of assistance: Complete Independence Comments: Patient ambulates with mild right lateral shift                                                                                                                                TREATMENT [facility] Adult PT Treatment:                                                DATE: 12/26/2023 Prone L3-5 CPA mobs Prone press-up 2 x 10 Standing lumbar extension SNAG with strap 2 x 10 LTR with legs crossed x 3 each Piriformis stretch x 20 sec each Deadlift from 4" box 50# 3 x 8 Kickstand deadlift 30# 2 x 8 each Figure-4 bridge 2 x 10 each Lateral band walk with blue at knees 3 x 15 down/back   PATIENT EDUCATION:  Education details: HEP update Person educated: Patient Education method: Explanation, Demonstration, Tactile cues, Verbal cues, and Handouts Education comprehension: verbalized understanding, returned demonstration, verbal cues required, tactile cues required, and needs further education  HOME EXERCISE PROGRAM: Access Code: [email or web address]    ASSESSMENT: CLINICAL IMPRESSION: Patient tolerated therapy well with no adverse effects. He arrived reporting some stiffness in the lower lumbar region so performed some manual for the lumbar spine and incorporated lumbar SNAG using strap with patient reporting improvement in the stiffness. Therapy continues to progress his core and hip strengthening with good tolerance. He does continue to report feeling more weakness of the right hip and core region. Progressed to more single leg strengthening and updated his HEP with good tolerance. He does require occasional cues for his lifting mechanics and exercise technique. Patient would benefit from continued skilled PT to progress mobility and strength in order to reduce pain and maximize functional ability.   Eval: Patient is a 48 y.o. male who was seen today for physical therapy evaluation and treatment for chronic right hip and lower back pain. Currently he does not exhibit any radicular symptoms on the right but does seem to have some positive nerve tension with slump test. He exhibits a  right lateral shift and is able to correct without pain. He  exhibit slight limitations in lumbar motion and gross strength deficits of the core and hips that is likely contributing to his pain and impacting his functional ability.   OBJECTIVE IMPAIRMENTS: decreased activity tolerance, decreased ROM, decreased strength, impaired flexibility, postural dysfunction, and pain.   ACTIVITY LIMITATIONS: lifting, standing, and locomotion level  PARTICIPATION LIMITATIONS: shopping and community activity  PERSONAL FACTORS: Fitness, Past/current experiences, and Time since onset of injury/illness/exacerbation are also affecting patient's functional outcome.    GOALS: Goals reviewed with patient? Yes  SHORT TERM GOALS: Target date: 01/09/2024  Patient will be I with initial HEP in order to progress with therapy. Baseline: HEP provided at eval Goal status: INITIAL  2.  Patient will report right lower back and hip pain </= 3/10 with walking and activity in order to reduce functional limitations Baseline: 6/10 Goal status: INITIAL  LONG TERM GOALS: Target date: 02/06/2024  Patient will be I with final HEP to maintain progress from PT. Baseline: HEP provided at eval Goal status: INITIAL  2.  Patient will report PSFS >/= 9 in order to indicate an improvement in his functional ability Baseline: 7 Goal status: INITIAL  3.  Patient will demonstrate hip strength >/= 4+/5 MMT in order to improve postural control and standing and walking tolerance Baseline: see limitations above Goal status: INITIAL  4.  Patient will demonstrate DLLT </= 30 deg in order to indicate improved core control in order to reduce pain and limitation with sparring Baseline: 45 deg Goal status: INITIAL   PLAN: PT FREQUENCY: 1x/week  PT DURATION: 8 weeks  PLANNED INTERVENTIONS: 97164- PT Re-evaluation, 97110-Therapeutic exercises, 97530- Therapeutic activity, 97112- Neuromuscular re-education, 97535- Self Care, 45409-  Manual therapy, 97032- Electrical stimulation (manual), Patient/Family education, Taping, Dry Needling, Joint mobilization, Joint manipulation, Spinal manipulation, Spinal mobilization, Cryotherapy, and Moist heat  PLAN FOR NEXT SESSION: Review HEP and progress PRN, continue progression of core and hip strengthening, manual/TPDN for right hip and lower back region PRN, right nerve glides PRN   Leah Primus, PT, DPT, LAT, ATC 12/26/23  11:02 AM Phone: [phone] Fax: [phone]

## 2024-01-02 ENCOUNTER — Other Ambulatory Visit: Payer: Self-pay

## 2024-01-02 ENCOUNTER — Ambulatory Visit: Admitting: Physical Therapy

## 2024-01-02 ENCOUNTER — Encounter: Payer: Self-pay | Admitting: Physical Therapy

## 2024-01-02 DIAGNOSIS — M25551 Pain in right hip: Secondary | ICD-10-CM

## 2024-01-02 DIAGNOSIS — M6281 Muscle weakness (generalized): Secondary | ICD-10-CM | POA: Diagnosis not present

## 2024-01-02 DIAGNOSIS — M5459 Other low back pain: Secondary | ICD-10-CM

## 2024-01-02 NOTE — Therapy (Signed)
 OUTPATIENT PHYSICAL THERAPY TREATMENT   Patient Name: Evan Phillips MRN: [id number] DOB:04-06-1976, 48 y.o., male Today's Date: 01/02/2024   END OF SESSION:  PT End of Session - 01/02/24 1447     Visit Number 4    Number of Visits 9    Date for PT Re-Evaluation 02/06/24    Authorization Type [employer]    PT Start Time 1430    PT Stop Time 1512    PT Time Calculation (min) 42 min    Activity Tolerance Patient tolerated treatment well    Behavior During Therapy [facility] for tasks assessed/performed                Past Medical History:  Diagnosis Date   Allergy    Anxiety    GERD (gastroesophageal reflux disease)    Hyperlipidemia    Hypertension    Past Surgical History:  Procedure Laterality Date   COLONOSCOPY  01/15/2015   Elvin Hammer hyperplastic polyp   KNEE SURGERY     WISDOM TOOTH EXTRACTION     Patient Active Problem List   Diagnosis Date Noted   Iron deficiency anemia due to chronic blood loss 01/23/2023   Prolapsed internal hemorrhoids, grade 4 01/23/2023    PCP: Imelda Man, MD  REFERRING PROVIDER: Syliva Even, MD  REFERRING DIAG: Right hip pain  THERAPY DIAG:  Pain in right hip  Other low back pain  Muscle weakness (generalized)  Rationale for Evaluation and Treatment: Rehabilitation  ONSET DATE: Chronic, 1-1.5 years   SUBJECTIVE:  SUBJECTIVE STATEMENT: Patient reports he states he back and hip are feeling better and looser.   Eval: Patient reports initially right hip pain in the front that would cause him to lean his upper body to the left and he would have to shift his hips back to the left to straighten up. Reports that since he has straightened his posture he has noticed pain more in the right lower back and more in the back of the hip rather than the front. Pain mainly when walking long distances or standing extended periods. He reports no mechanism of injury. He will get some cramping in the right side and lower back if he is working  upright for a while. He does do some light jiu-jitsu and sparring, and does lift weights. Denies any pain down the leg.   PERTINENT HISTORY: See PMH above  PAIN:  Are you having pain? Yes:  NPRS scale: 0/10 currently, 3/10 at worst Pain location: Right hip Pain description: Muscle pain Aggravating factors: Standing, walking Relieving factors: Stretching  PRECAUTIONS: None  PATIENT GOALS: Stop hurting, stand up straight, be able to walk   OBJECTIVE:  Note: Objective measures were completed at Evaluation unless otherwise noted. PATIENT SURVEYS:  PSFS: 7 Standing for long periods: 5 Walking long distances: 8 Sparring: 8  MUSCLE LENGTH: Slight limitations with bilateral hamstring length, right piriformis tightness  POSTURE:   Mild right lateral shift  PALPATION: Non tender to right lumbar paraspinals and hip region  LUMBAR ROM:   Active  A/PROM  eval  Flexion 75%  Extension 75%  Right lateral flexion WFL  Left lateral flexion WFL  Right rotation WFL  Left rotation WFL   (Blank rows = not tested)  Reports twinge right buttock region with extension, feeling of right sided weakness with side bend and rotations  LOWER EXTREMITY ROM:   Hip PROM grossly WFL  LOWER EXTREMITY MMT:  MMT Right eval Left eval  Hip flexion 5 5  Hip extension 4- 4  Hip abduction 4- 4  Hip adduction    Hip internal rotation    Hip external rotation    Knee flexion 5 5  Knee extension 5 5  Ankle dorsiflexion    Ankle plantarflexion    Ankle inversion    Ankle eversion     (Blank rows = not tested)  LOWER EXTREMITY SPECIAL TESTS:  Slump positive on right  FUNCTIONAL TESTS:  DLLT: loss of lumbar control at 45 deg   GAIT: Assistive device utilized: None Level of assistance: Complete Independence Comments: Patient ambulates with mild right lateral shift                                                                                                                                TREATMENT [facility] Adult PT Treatment:                                                DATE: 01/02/2024 Recumbent bike L5 x 5 min to improve endurance and workload capacity Prone L3-5 CPA mobilizations Prone press-up 2 x 10 Figure-4 bridge 2 x 10 each, with 25# KB over hip x 10 each Modified side plank and clamshell with green 3 x 10 each Deadlift with 50# 3 x 10 SL RDL with back foot on table holding 15# 2 x 10 each Standing hip airplane 2 x 10 KB swing 40# 2 x 12  PATIENT EDUCATION:  Education details: HEP update Person educated: Patient Education method: Explanation, Demonstration, Tactile cues, Verbal cues, and Handouts Education comprehension: verbalized understanding, returned demonstration, verbal cues required, tactile cues required, and needs further education  HOME EXERCISE PROGRAM: Access Code: [email or web address]    ASSESSMENT: CLINICAL IMPRESSION: Patient tolerated therapy well with no adverse effects. He reports continued improvement in hip and back symptoms but does note continued difficulty with occasional pain while performing more explosive bridge movements while sparring. Therapy continued to focus on progression of hip strength and control with good tolerance. Added weight for his bridge exercise and incorporated KB swing for more explosive hip movements. He did report right gluteal pain with SL RDL so limited the range of movement and he reports that improved the pain but he still felt a little unsteady on the right. Updated his HEP to progress load and explosive movement for hips strengthening. Patient would benefit from continued skilled PT to progress mobility and strength in order to reduce pain and maximize functional ability.   Eval: Patient is a 48 y.o. male who was seen today for physical therapy evaluation and treatment for chronic right hip and lower back pain. Currently he does not exhibit any radicular symptoms on the right but does seem to have some positive nerve  tension with slump test. He exhibits a right lateral shift and is able  to correct without pain. He exhibit slight limitations in lumbar motion and gross strength deficits of the core and hips that is likely contributing to his pain and impacting his functional ability.   OBJECTIVE IMPAIRMENTS: decreased activity tolerance, decreased ROM, decreased strength, impaired flexibility, postural dysfunction, and pain.   ACTIVITY LIMITATIONS: lifting, standing, and locomotion level  PARTICIPATION LIMITATIONS: shopping and community activity  PERSONAL FACTORS: Fitness, Past/current experiences, and Time since onset of injury/illness/exacerbation are also affecting patient's functional outcome.    GOALS: Goals reviewed with patient? Yes  SHORT TERM GOALS: Target date: 01/09/2024  Patient will be I with initial HEP in order to progress with therapy. Baseline: HEP provided at eval Goal status: INITIAL  2.  Patient will report right lower back and hip pain </= 3/10 with walking and activity in order to reduce functional limitations Baseline: 6/10 Goal status: INITIAL  LONG TERM GOALS: Target date: 02/06/2024  Patient will be I with final HEP to maintain progress from PT. Baseline: HEP provided at eval Goal status: INITIAL  2.  Patient will report PSFS >/= 9 in order to indicate an improvement in his functional ability Baseline: 7 Goal status: INITIAL  3.  Patient will demonstrate hip strength >/= 4+/5 MMT in order to improve postural control and standing and walking tolerance Baseline: see limitations above Goal status: INITIAL  4.  Patient will demonstrate DLLT </= 30 deg in order to indicate improved core control in order to reduce pain and limitation with sparring Baseline: 45 deg Goal status: INITIAL   PLAN: PT FREQUENCY: 1x/week  PT DURATION: 8 weeks  PLANNED INTERVENTIONS: 97164- PT Re-evaluation, 97110-Therapeutic exercises, 97530- Therapeutic activity, 97112- Neuromuscular  re-education, 97535- Self Care, 29562- Manual therapy, 97032- Electrical stimulation (manual), Patient/Family education, Taping, Dry Needling, Joint mobilization, Joint manipulation, Spinal manipulation, Spinal mobilization, Cryotherapy, and Moist heat  PLAN FOR NEXT SESSION: Review HEP and progress PRN, continue progression of core and hip strengthening, manual/TPDN for right hip and lower back region PRN, right nerve glides PRN   Leah Primus, PT, DPT, LAT, ATC 01/02/24  3:16 PM Phone: [phone] Fax: [phone]

## 2024-01-09 ENCOUNTER — Other Ambulatory Visit: Payer: Self-pay

## 2024-01-09 ENCOUNTER — Ambulatory Visit: Admitting: Physical Therapy

## 2024-01-09 ENCOUNTER — Encounter: Payer: Self-pay | Admitting: Physical Therapy

## 2024-01-09 DIAGNOSIS — M5459 Other low back pain: Secondary | ICD-10-CM | POA: Diagnosis not present

## 2024-01-09 DIAGNOSIS — M25551 Pain in right hip: Secondary | ICD-10-CM

## 2024-01-09 DIAGNOSIS — M6281 Muscle weakness (generalized): Secondary | ICD-10-CM

## 2024-01-09 NOTE — Therapy (Addendum)
 OUTPATIENT PHYSICAL THERAPY TREATMENT  DISCHARGE   Patient Name: Evan Phillips MRN: [id number] DOB:[birth year], 48 y.o., male Today's Date: 01/09/2024   END OF SESSION:  PT End of Session - 01/09/24 1442     Visit Number 5    Number of Visits 9    Date for PT Re-Evaluation 02/06/24    Authorization Type [employer]    PT Start Time 1433    PT Stop Time 1511    PT Time Calculation (min) 38 min    Activity Tolerance Patient tolerated treatment well    Behavior During Therapy [facility] for tasks assessed/performed                Past Medical History:  Diagnosis Date   Allergy    Anxiety    GERD (gastroesophageal reflux disease)    Hyperlipidemia    Hypertension    Past Surgical History:  Procedure Laterality Date   COLONOSCOPY  01/15/2015   Abran hyperplastic polyp   KNEE SURGERY     WISDOM TOOTH EXTRACTION     Patient Active Problem List   Diagnosis Date Noted   Iron deficiency anemia due to chronic blood loss 01/23/2023   Prolapsed internal hemorrhoids, grade 4 01/23/2023    PCP: Clarice Nottingham, MD  REFERRING PROVIDER: Joane Artist RAMAN, MD  REFERRING DIAG: Right hip pain  THERAPY DIAG:  Pain in right hip  Other low back pain  Muscle weakness (generalized)  Rationale for Evaluation and Treatment: Rehabilitation  ONSET DATE: Chronic, 1-1.5 years   SUBJECTIVE:  SUBJECTIVE STATEMENT: Patient reports he is doing good and states that as long as he does his stretches before his sparring then he doesn't have any issue or pain. He is consistent with his HEP.  Eval: Patient reports initially right hip pain in the front that would cause him to lean his upper body to the left and he would have to shift his hips back to the left to straighten up. Reports that since he has straightened his posture he has noticed pain more in the right lower back and more in the back of the hip rather than the front. Pain mainly when walking long distances or standing extended periods. He  reports no mechanism of injury. He will get some cramping in the right side and lower back if he is working upright for a while. He does do some light jiu-jitsu and sparring, and does lift weights. Denies any pain down the leg.   PERTINENT HISTORY: See PMH above  PAIN:  Are you having pain? Yes:  NPRS scale: 0/10 currently Pain location: Right hip Pain description: Muscle pain Aggravating factors: Standing, walking Relieving factors: Stretching  PRECAUTIONS: None  PATIENT GOALS: Stop hurting, stand up straight, be able to walk   OBJECTIVE:  Note: Objective measures were completed at Evaluation unless otherwise noted. PATIENT SURVEYS:  PSFS: 7 Standing for long periods: 5 Walking long distances: 8 Sparring: 8  Reassessed 01/09/2024:  PSFS: 9.7 Standing for long periods: 10 Walking long distances: 10 Sparring: 9  MUSCLE LENGTH: Slight limitations with bilateral hamstring length, right piriformis tightness  POSTURE:   Mild right lateral shift  PALPATION: Non tender to right lumbar paraspinals and hip region  LUMBAR ROM:   Active  A/PROM  eval  Flexion 75%  Extension 75%  Right lateral flexion WFL  Left lateral flexion WFL  Right rotation WFL  Left rotation WFL   (Blank rows = not tested)  Reports twinge right buttock region with extension,  feeling of right sided weakness with side bend and rotations  LOWER EXTREMITY ROM:   Hip PROM grossly WFL  LOWER EXTREMITY MMT:  MMT Right eval Left eval Rt / Lt 01/09/2024  Hip flexion 5 5   Hip extension 4- 4 4+ / 4+  Hip abduction 4- 4 4+ / 4+  Hip adduction     Hip internal rotation     Hip external rotation     Knee flexion 5 5   Knee extension 5 5   Ankle dorsiflexion     Ankle plantarflexion     Ankle inversion     Ankle eversion      (Blank rows = not tested)  LOWER EXTREMITY SPECIAL TESTS:  Slump positive on right  FUNCTIONAL TESTS:  DLLT: loss of lumbar control at 45 deg   01/09/2024: 20  deg  GAIT: Assistive device utilized: None Level of assistance: Complete Independence Comments: Patient ambulates with mild right lateral shift                                                                                                                               TREATMENT [facility] Adult PT Treatment:                                                DATE: 01/09/2024 Standing self lumbar extension SNAG LTR with legs crossed x 3 each Trial versions of hamstring stretch seated and longsitting Figure-4 bridge with 25# KB over hip 2 x 10 each Deadlift with 50# x 10 KB swing 40# x10 Lateral band walk with black at knees 2 x 20 down/back, at ankles x 20 down/back Pallof press with black x 10 each  Educated patient on good stretching and warm-up routine prior to sparring. How to progress his HEP exercises using increased resistance, increasing reps, performing exercises slower.   PATIENT EDUCATION:  Education details: HEP, follow-up with referring provider Person educated: Patient Education method: Explanation, Demonstration, Tactile cues, Verbal cues, and Handouts Education comprehension: verbalized understanding, returned demonstration, verbal cues required, tactile cues required, and needs further education  HOME EXERCISE PROGRAM: Access Code: [email or web address]    ASSESSMENT: CLINICAL IMPRESSION: Patient tolerated therapy well with no adverse effects. Overall he has progress well with therapy and has achieved all his established goals, demonstrating improvement in his strength, pain level, and functional ability. He was able to perform all exercises this visit with good technique and reviewed methods for progression of HEP at home. He does report some proximal hamstring and hip tightness but states that with stretching and exercise this improves. He will follow-up with the referring provider and determine whether further PT is indicated.   Eval: Patient is a 48 y.o. male who was seen today for  physical therapy evaluation and treatment for chronic right hip and lower  back pain. Currently he does not exhibit any radicular symptoms on the right but does seem to have some positive nerve tension with slump test. He exhibits a right lateral shift and is able to correct without pain. He exhibit slight limitations in lumbar motion and gross strength deficits of the core and hips that is likely contributing to his pain and impacting his functional ability.   OBJECTIVE IMPAIRMENTS: decreased activity tolerance, decreased ROM, decreased strength, impaired flexibility, postural dysfunction, and pain.   ACTIVITY LIMITATIONS: lifting, standing, and locomotion level  PARTICIPATION LIMITATIONS: shopping and community activity  PERSONAL FACTORS: Fitness, Past/current experiences, and Time since onset of injury/illness/exacerbation are also affecting patient's functional outcome.    GOALS: Goals reviewed with patient? Yes  SHORT TERM GOALS: Target date: 01/09/2024  Patient will be I with initial HEP in order to progress with therapy. Baseline: HEP provided at eval 01/09/2024: independent Goal status: MET  2.  Patient will report right lower back and hip pain </= 3/10 with walking and activity in order to reduce functional limitations Baseline: 6/10 01/09/2024: patient denies pain Goal status: MET  LONG TERM GOALS: Target date: 02/06/2024  Patient will be I with final HEP to maintain progress from PT. Baseline: HEP provided at eval 01/09/2024: independent Goal status: MET  2.  Patient will report PSFS >/= 9 in order to indicate an improvement in his functional ability Baseline: 7 01/09/2024: 9.7 Goal status: MET  3.  Patient will demonstrate hip strength >/= 4+/5 MMT in order to improve postural control and standing and walking tolerance Baseline: see limitations above 01/09/2024: 4+/5 Goal status: MET  4.  Patient will demonstrate DLLT </= 30 deg in order to indicate improved core control in  order to reduce pain and limitation with sparring Baseline: 45 deg 01/09/2024: 20 deg Goal status: MET   PLAN: PT FREQUENCY: 1x/week  PT DURATION: 8 weeks  PLANNED INTERVENTIONS: 97164- PT Re-evaluation, 97110-Therapeutic exercises, 97530- Therapeutic activity, 97112- Neuromuscular re-education, 97535- Self Care, [id number]- Manual therapy, [phone]- Electrical stimulation (manual), Patient/Family education, Taping, Dry Needling, Joint mobilization, Joint manipulation, Spinal manipulation, Spinal mobilization, Cryotherapy, and Moist heat  PLAN FOR NEXT SESSION: Review HEP and progress PRN, continue progression of core and hip strengthening, manual/TPDN for right hip and lower back region PRN, right nerve glides PRN   Elaine Daring, PT, DPT, LAT, ATC 01/09/24  4:52 PM Phone: [phone] Fax: [phone]   PHYSICAL THERAPY DISCHARGE SUMMARY  Visits from Start of Care: 5  Current functional level related to goals / functional outcomes: See above   Remaining deficits: See above   Education / Equipment: HEP   Patient agrees to discharge. Patient goals were met. Patient is being discharged due to meeting the stated rehab goals.  Elaine Daring, PT, DPT, LAT, ATC 06/26/24  11:25 AM Phone: [phone] Fax: [phone]

## 2024-01-17 ENCOUNTER — Other Ambulatory Visit: Payer: Self-pay

## 2024-01-17 ENCOUNTER — Ambulatory Visit: Admitting: Family Medicine

## 2024-01-17 ENCOUNTER — Encounter: Payer: Self-pay | Admitting: Family Medicine

## 2024-01-17 VITALS — BP 130/84 | HR 85 | Ht 70.0 in | Wt 207.0 lb

## 2024-01-17 DIAGNOSIS — M25551 Pain in right hip: Secondary | ICD-10-CM | POA: Diagnosis not present

## 2024-01-17 NOTE — Patient Instructions (Signed)
 Thank you for coming in today.   See you back as needed.

## 2024-01-17 NOTE — Progress Notes (Unsigned)
   I, Evan Phillips, CMA acting as a scribe for Evan Juniper, MD.  Evan Phillips is a 48 y.o. male who presents to [facility] at [facility] today for f/u R hip pain. Pt was last seen by Dr. Alease Hunter on 12/06/23 and was referred to PT, completing 5 visits.   Today, pt reports improvement with PT and HEP, 95% improvement. Will continue stretching strengthening. Discharged from PT last week.   Dx imaging: 12/06/23 R hip XR 10/26/22 L-spine XR   Pertinent review of systems: No fevers or chills  Relevant historical information: Iron deficiency anemia   Exam:  BP 130/84   Pulse 85   Ht 5\' 10"  (1.778 m)   Wt 207 lb (93.9 kg)   SpO2 97%   BMI 29.70 kg/m  General: Well Developed, well nourished, and in no acute distress.   MSK: Right hip normal-appearing normal motion intact strength normal gait.      Assessment and Plan: 48 y.o. male with right hip pain significantly improved with physical therapy.  Plan to continue home exercise program and check back as needed.  Happy to add PT again in the future.   PDMP not reviewed this encounter. No orders of the defined types were placed in this encounter.  No orders of the defined types were placed in this encounter.    Discussed warning signs or symptoms. Please see discharge instructions. Patient expresses understanding.   The above documentation has been reviewed and is accurate and complete Evan Phillips, M.D.

## 2024-02-19 ENCOUNTER — Other Ambulatory Visit: Payer: Self-pay | Admitting: Internal Medicine
